# Patient Record
Sex: Male | Born: 1950 | Race: Black or African American | Hispanic: No | Smoking: Former smoker
Health system: Southern US, Community
[De-identification: ages and names within clinical notes are randomized; demographics above are authoritative.]

## PROBLEM LIST (undated history)

## (undated) DIAGNOSIS — F1010 Alcohol abuse, uncomplicated: Secondary | ICD-10-CM

## (undated) DIAGNOSIS — I10 Essential (primary) hypertension: Secondary | ICD-10-CM

## (undated) DIAGNOSIS — I4891 Unspecified atrial fibrillation: Secondary | ICD-10-CM

## (undated) DIAGNOSIS — E785 Hyperlipidemia, unspecified: Secondary | ICD-10-CM

## (undated) DIAGNOSIS — R569 Unspecified convulsions: Secondary | ICD-10-CM

## (undated) HISTORY — DX: Hyperlipidemia, unspecified: E78.5

## (undated) HISTORY — PX: SHOULDER SURGERY: SHX246

## (undated) HISTORY — DX: Unspecified atrial fibrillation: I48.91

---

## 2014-08-23 DIAGNOSIS — I4891 Unspecified atrial fibrillation: Secondary | ICD-10-CM

## 2014-08-23 HISTORY — DX: Unspecified atrial fibrillation: I48.91

## 2014-08-25 ENCOUNTER — Emergency Department (HOSPITAL_COMMUNITY): Payer: Self-pay

## 2014-08-25 ENCOUNTER — Encounter (HOSPITAL_COMMUNITY): Payer: Self-pay | Admitting: *Deleted

## 2014-08-25 ENCOUNTER — Inpatient Hospital Stay (HOSPITAL_COMMUNITY)
Admission: EM | Admit: 2014-08-25 | Discharge: 2014-09-08 | DRG: 897 | Disposition: A | Discharge status: Home Health | Payer: Self-pay | Attending: Internal Medicine | Admitting: Internal Medicine

## 2014-08-25 DIAGNOSIS — Y901 Blood alcohol level of 20-39 mg/100 ml: Secondary | ICD-10-CM | POA: Diagnosis present

## 2014-08-25 DIAGNOSIS — F329 Major depressive disorder, single episode, unspecified: Secondary | ICD-10-CM | POA: Diagnosis present

## 2014-08-25 DIAGNOSIS — R7303 Prediabetes: Secondary | ICD-10-CM | POA: Diagnosis present

## 2014-08-25 DIAGNOSIS — E872 Acidosis, unspecified: Secondary | ICD-10-CM | POA: Diagnosis present

## 2014-08-25 DIAGNOSIS — R7989 Other specified abnormal findings of blood chemistry: Secondary | ICD-10-CM

## 2014-08-25 DIAGNOSIS — F1020 Alcohol dependence, uncomplicated: Secondary | ICD-10-CM | POA: Diagnosis present

## 2014-08-25 DIAGNOSIS — E876 Hypokalemia: Secondary | ICD-10-CM | POA: Diagnosis not present

## 2014-08-25 DIAGNOSIS — F10230 Alcohol dependence with withdrawal, uncomplicated: Principal | ICD-10-CM | POA: Diagnosis present

## 2014-08-25 DIAGNOSIS — R739 Hyperglycemia, unspecified: Secondary | ICD-10-CM | POA: Diagnosis present

## 2014-08-25 DIAGNOSIS — D696 Thrombocytopenia, unspecified: Secondary | ICD-10-CM | POA: Diagnosis present

## 2014-08-25 DIAGNOSIS — I429 Cardiomyopathy, unspecified: Secondary | ICD-10-CM | POA: Diagnosis present

## 2014-08-25 DIAGNOSIS — F10239 Alcohol dependence with withdrawal, unspecified: Secondary | ICD-10-CM | POA: Diagnosis present

## 2014-08-25 DIAGNOSIS — E785 Hyperlipidemia, unspecified: Secondary | ICD-10-CM | POA: Diagnosis present

## 2014-08-25 DIAGNOSIS — Z91018 Allergy to other foods: Secondary | ICD-10-CM

## 2014-08-25 DIAGNOSIS — I4892 Unspecified atrial flutter: Secondary | ICD-10-CM | POA: Diagnosis present

## 2014-08-25 DIAGNOSIS — R778 Other specified abnormalities of plasma proteins: Secondary | ICD-10-CM | POA: Diagnosis present

## 2014-08-25 DIAGNOSIS — R945 Abnormal results of liver function studies: Secondary | ICD-10-CM

## 2014-08-25 DIAGNOSIS — I10 Essential (primary) hypertension: Secondary | ICD-10-CM | POA: Diagnosis present

## 2014-08-25 DIAGNOSIS — R0602 Shortness of breath: Secondary | ICD-10-CM

## 2014-08-25 DIAGNOSIS — I4891 Unspecified atrial fibrillation: Secondary | ICD-10-CM | POA: Diagnosis present

## 2014-08-25 DIAGNOSIS — K7010 Alcoholic hepatitis without ascites: Secondary | ICD-10-CM | POA: Diagnosis present

## 2014-08-25 DIAGNOSIS — G4089 Other seizures: Secondary | ICD-10-CM | POA: Diagnosis present

## 2014-08-25 DIAGNOSIS — I248 Other forms of acute ischemic heart disease: Secondary | ICD-10-CM | POA: Diagnosis present

## 2014-08-25 DIAGNOSIS — R569 Unspecified convulsions: Secondary | ICD-10-CM

## 2014-08-25 DIAGNOSIS — F10939 Alcohol use, unspecified with withdrawal, unspecified: Secondary | ICD-10-CM | POA: Diagnosis present

## 2014-08-25 HISTORY — DX: Essential (primary) hypertension: I10

## 2014-08-25 HISTORY — DX: Alcohol abuse, uncomplicated: F10.10

## 2014-08-25 HISTORY — DX: Unspecified convulsions: R56.9

## 2014-08-25 LAB — CBC
HEMATOCRIT: 42.9 % (ref 39.0–52.0)
Hemoglobin: 14.4 g/dL (ref 13.0–17.0)
MCH: 33.4 pg (ref 26.0–34.0)
MCHC: 33.6 g/dL (ref 30.0–36.0)
MCV: 99.5 fL (ref 78.0–100.0)
Platelets: 108 10*3/uL — ABNORMAL LOW (ref 150–400)
RBC: 4.31 MIL/uL (ref 4.22–5.81)
RDW: 13.3 % (ref 11.5–15.5)
WBC: 3.5 10*3/uL — ABNORMAL LOW (ref 4.0–10.5)

## 2014-08-25 LAB — BASIC METABOLIC PANEL
BUN: 7 mg/dL (ref 6–23)
CO2: 13 mmol/L — ABNORMAL LOW (ref 19–32)
Calcium: 9.3 mg/dL (ref 8.4–10.5)
Chloride: 105 mmol/L (ref 96–112)
Creatinine, Ser: 1.21 mg/dL (ref 0.50–1.35)
GFR calc Af Amer: 72 mL/min — ABNORMAL LOW (ref >90)
GFR calc non Af Amer: 62 mL/min — ABNORMAL LOW (ref >90)
GLUCOSE: 190 mg/dL — AB (ref 70–99)
Potassium: 4.1 mmol/L (ref 3.5–5.1)
Sodium: 143 mmol/L (ref 135–145)

## 2014-08-25 LAB — LACTIC ACID, PLASMA: LACTIC ACID, VENOUS: 2.4 mmol/L — AB (ref 0.5–2.0)

## 2014-08-25 LAB — RAPID URINE DRUG SCREEN, HOSP PERFORMED
Amphetamines: NOT DETECTED
BARBITURATES: NOT DETECTED
BENZODIAZEPINES: NOT DETECTED
Cocaine: NOT DETECTED
OPIATES: NOT DETECTED
Tetrahydrocannabinol: POSITIVE — AB

## 2014-08-25 LAB — MRSA PCR SCREENING: MRSA by PCR: NEGATIVE

## 2014-08-25 LAB — HEPATIC FUNCTION PANEL
ALT: 191 U/L — ABNORMAL HIGH (ref 0–53)
AST: 280 U/L — ABNORMAL HIGH (ref 0–37)
Albumin: 4.5 g/dL (ref 3.5–5.2)
Alkaline Phosphatase: 70 U/L (ref 39–117)
BILIRUBIN DIRECT: 0.4 mg/dL (ref 0.0–0.5)
BILIRUBIN TOTAL: 1.7 mg/dL — AB (ref 0.3–1.2)
Indirect Bilirubin: 1.3 mg/dL — ABNORMAL HIGH (ref 0.3–0.9)
TOTAL PROTEIN: 9 g/dL — AB (ref 6.0–8.3)

## 2014-08-25 LAB — TROPONIN I: Troponin I: 0.07 ng/mL — ABNORMAL HIGH (ref <0.031)

## 2014-08-25 LAB — CBG MONITORING, ED: Glucose-Capillary: 178 mg/dL — ABNORMAL HIGH (ref 70–99)

## 2014-08-25 LAB — ETHANOL: Alcohol, Ethyl (B): 34 mg/dL — ABNORMAL HIGH (ref 0–9)

## 2014-08-25 LAB — I-STAT TROPONIN, ED: TROPONIN I, POC: 0.01 ng/mL (ref 0.00–0.08)

## 2014-08-25 IMAGING — CR DG CHEST 1V PORT
1 series · 1 of 1 positions shown · non-contrast
Comparison: None.

CLINICAL DATA: Shortness of breath

EXAM:
PORTABLE CHEST - 1 VIEW

[AP]
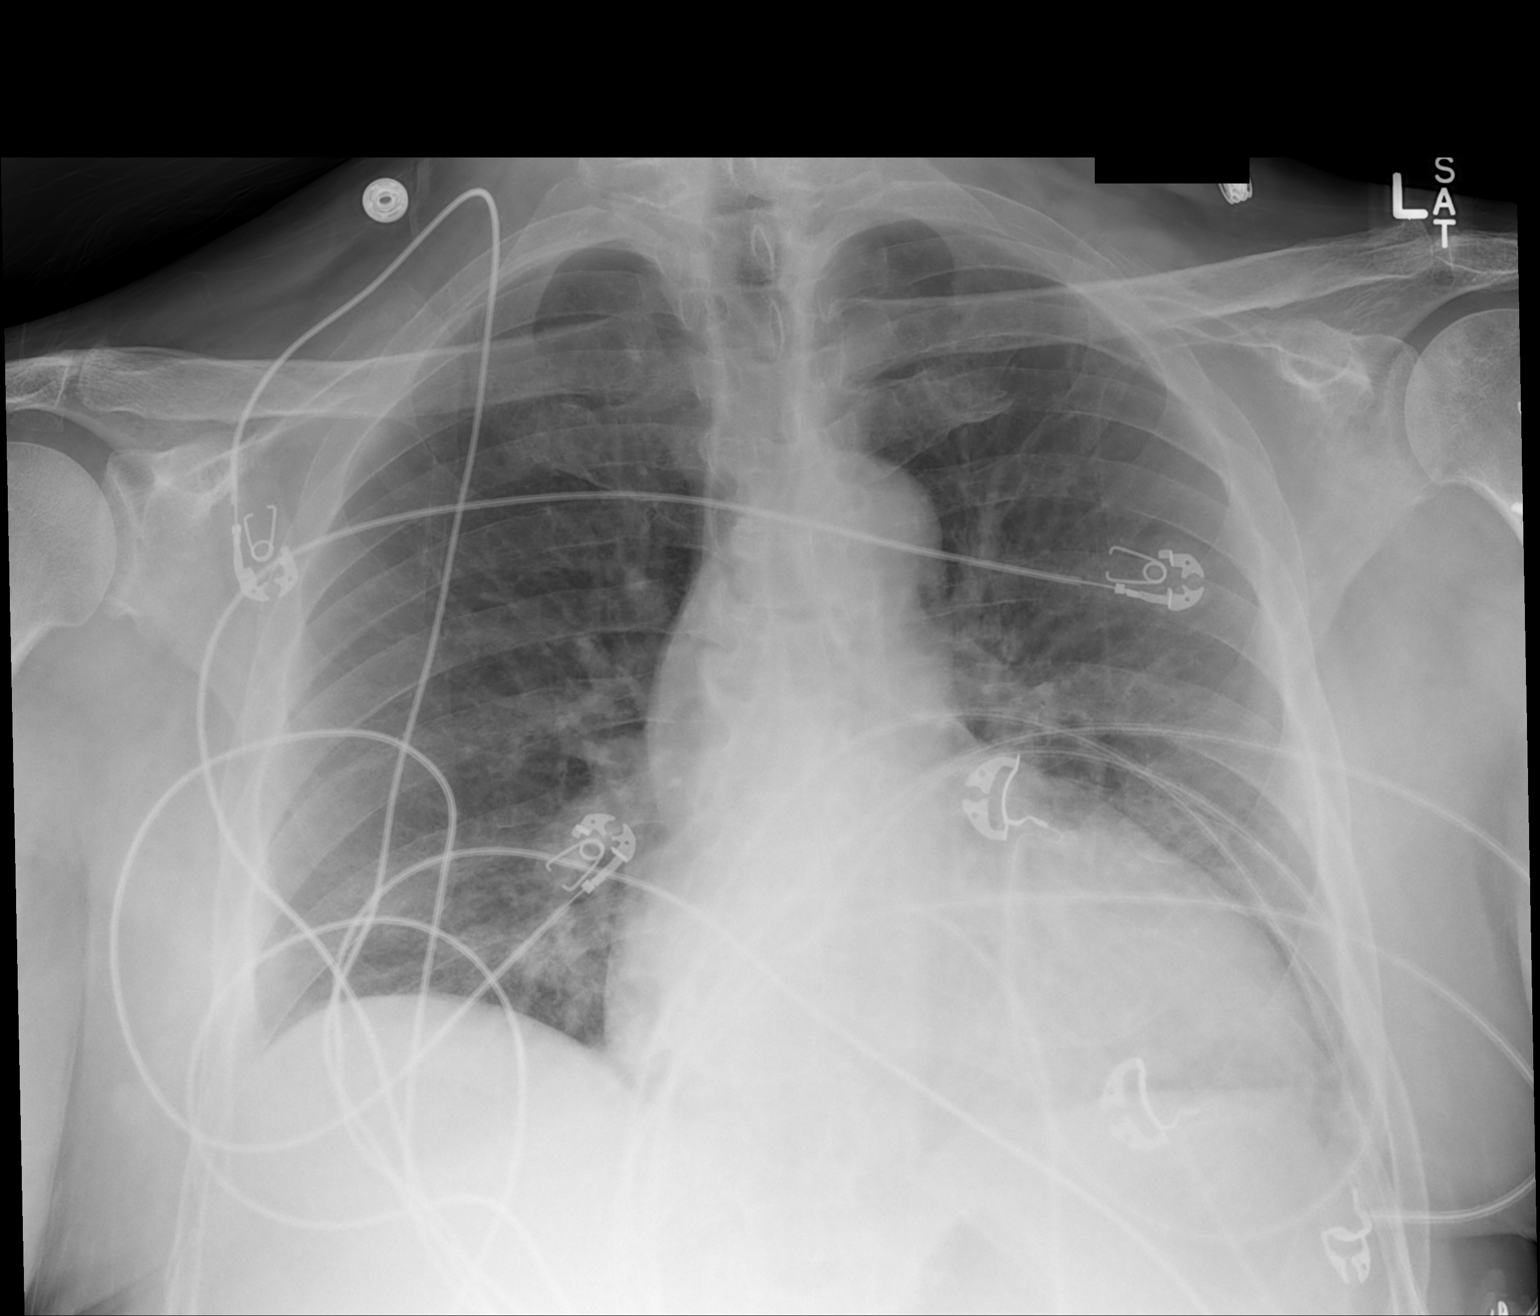

[1 of 1 positions shown; findings below may reference images not displayed]

FINDINGS: The heart size is moderately enlarged without evidence for edema.
Cardiac leads obscure detail. Both lungs are clear. The visualized
skeletal structures are unremarkable.
IMPRESSION: Moderate cardiomegaly with central vascular congestion but no focal
acute finding.

## 2014-08-25 IMAGING — CT CT HEAD W/O CM
2 series · 17 of 30 positions shown, 20 images · non-contrast
Comparison: None.

CLINICAL DATA: Ethanol abuse and seizure.

EXAM:
CT HEAD WITHOUT CONTRAST
TECHNIQUE: Contiguous axial images were obtained from the base of the skull
through the vertex without intravenous contrast.

[Series 2: head w/o · axial · non-contrast · 0.46mm/px · z∈[-241,-126]mm · 9 of 29 slices shown, 12 images]
[im 3/29  brain]
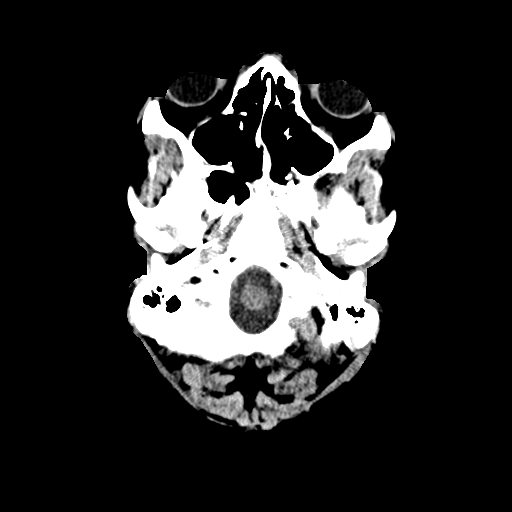
[im 3/29  bone]
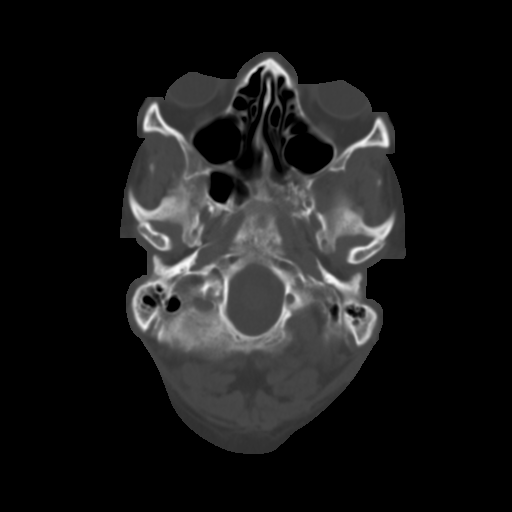
[im 6/29  brain]
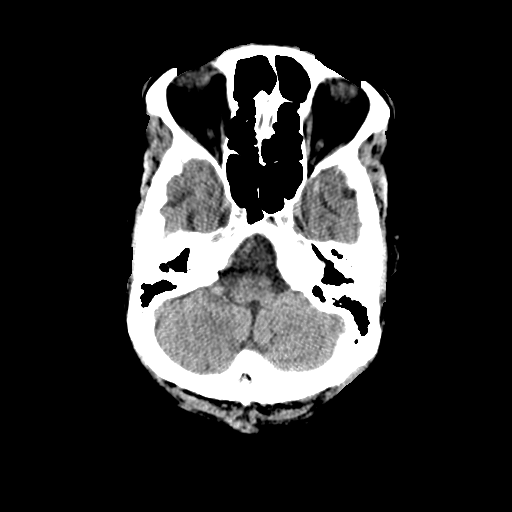
[im 9/29  brain]
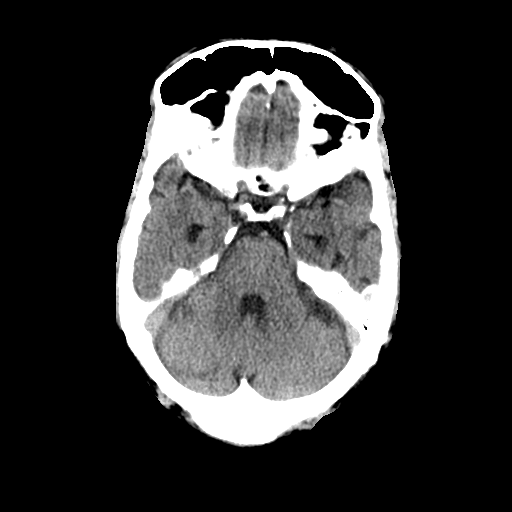
[im 12/29  brain]
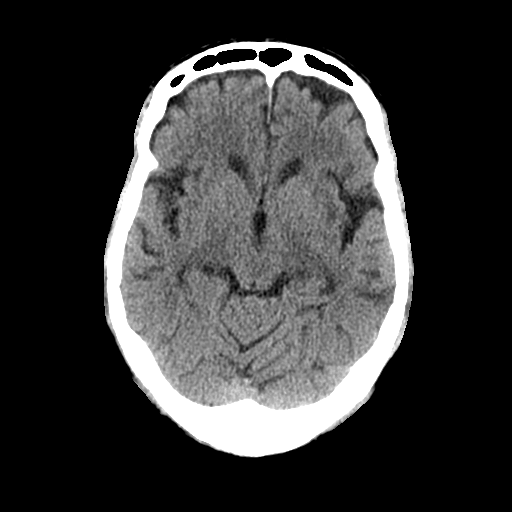
[im 15/29  brain]
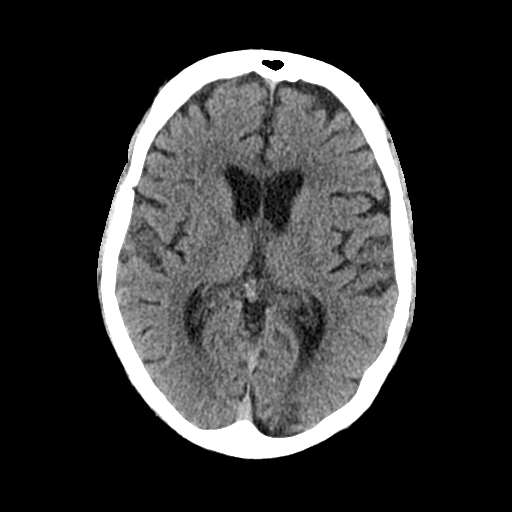
[im 15/29  bone]
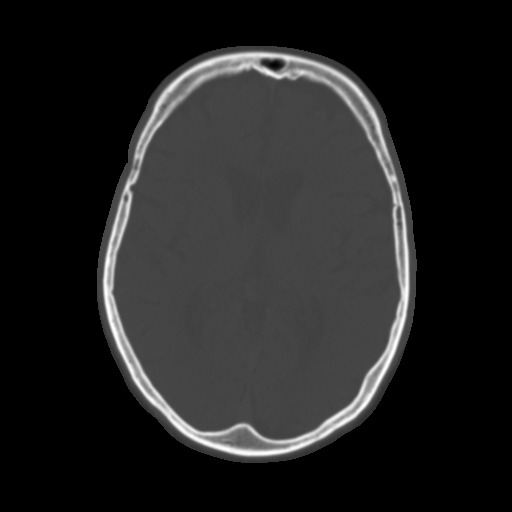
[im 17/29  brain]
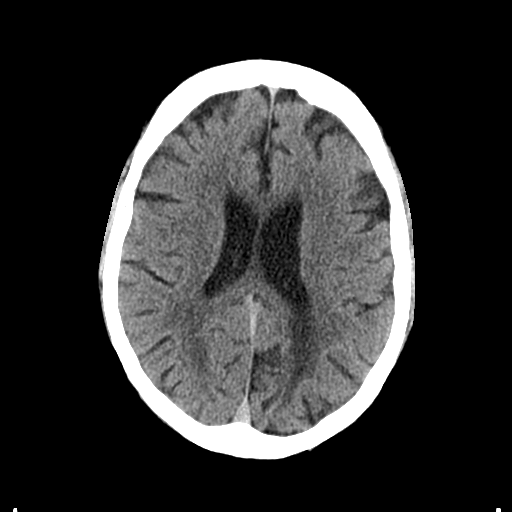
[im 20/29  brain]
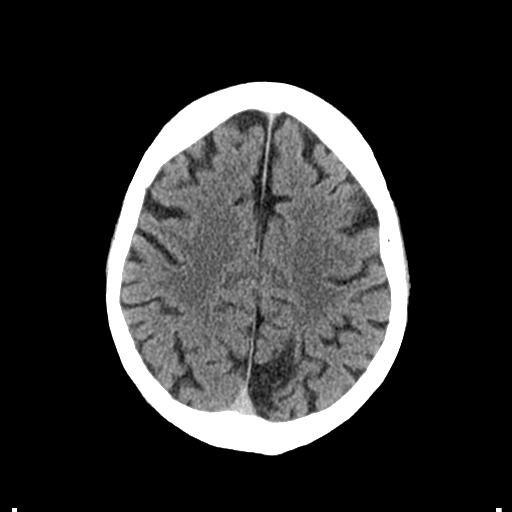
[im 23/29  brain]
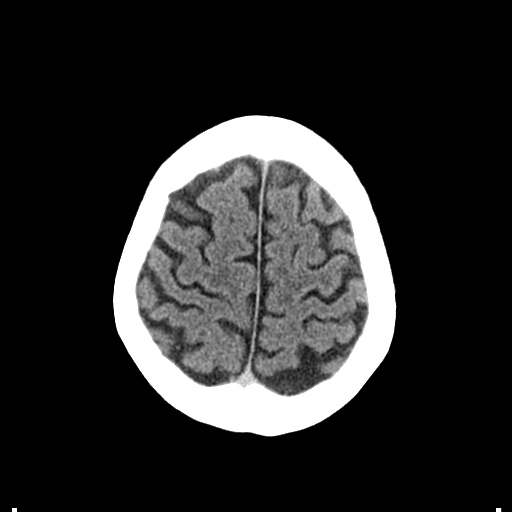
[im 26/29  brain]
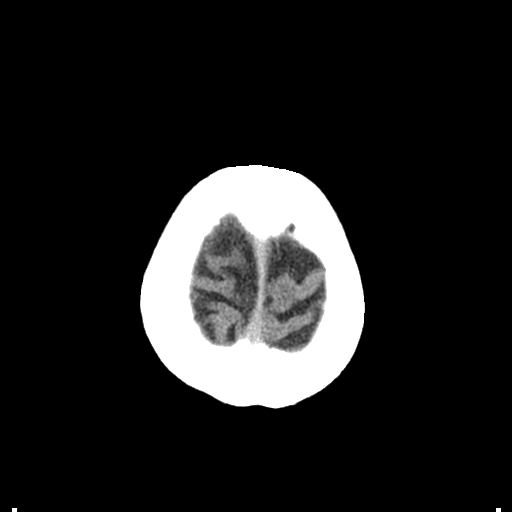
[im 26/29  bone]
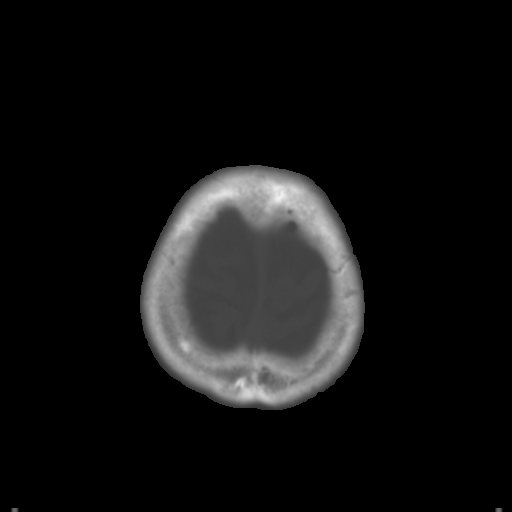

[Series 3: bone windows · axial · 0.46mm/px · z∈[-236,-128]mm · 8 of 48 slices shown]
[im 6/48  bone]
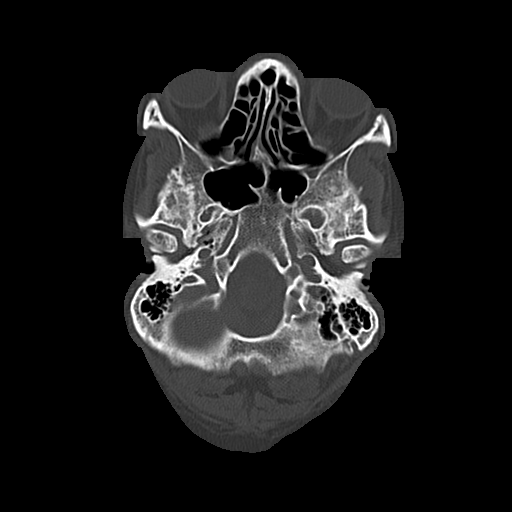
[im 11/48  bone]
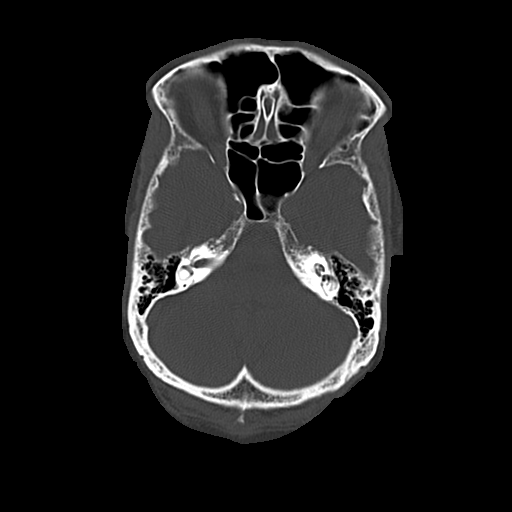
[im 16/48  bone]
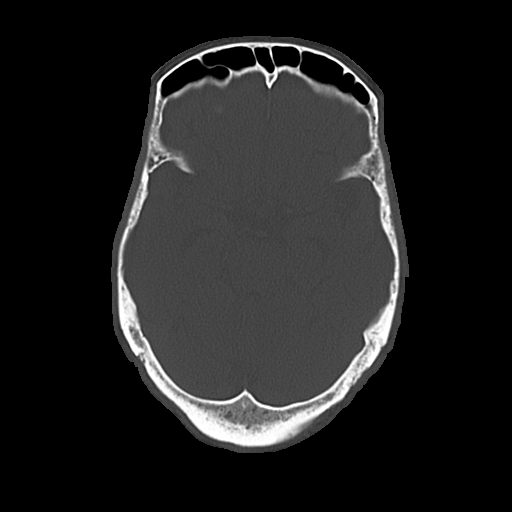
[im 21/48  bone]
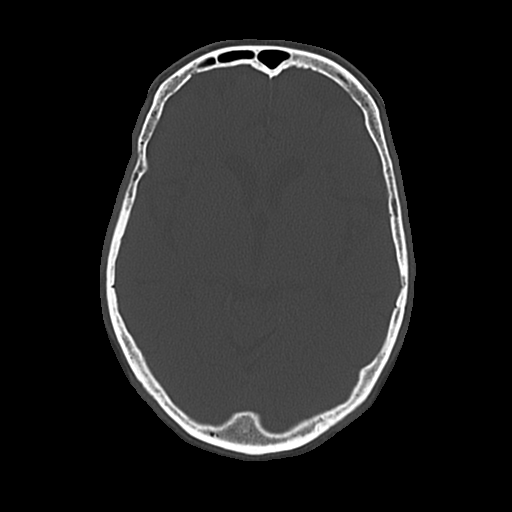
[im 27/48  bone]
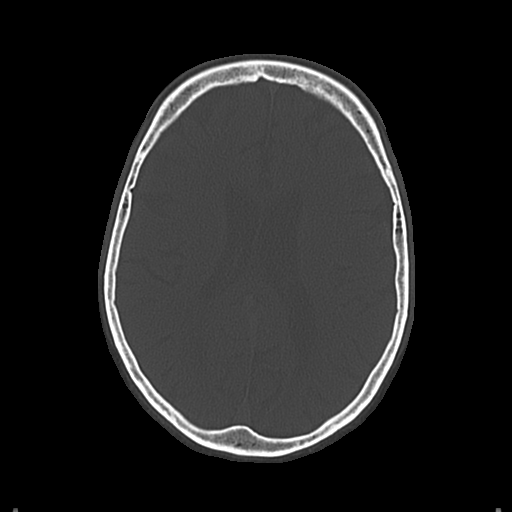
[im 32/48  bone]
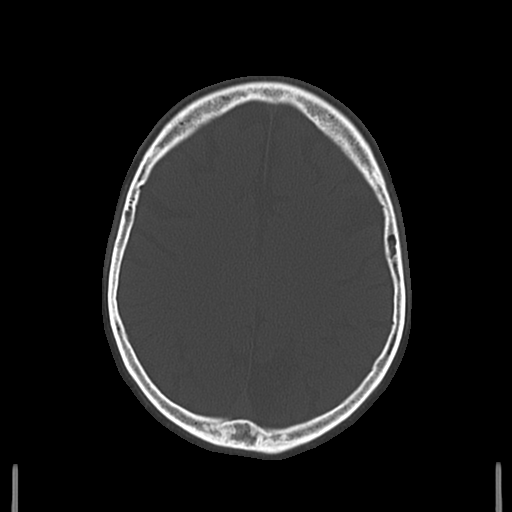
[im 37/48  bone]
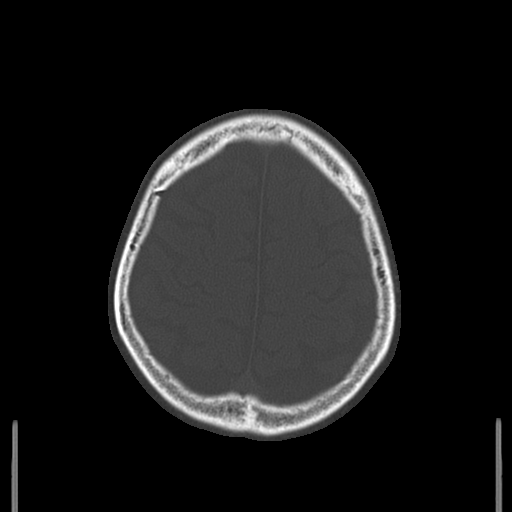
[im 42/48  bone]
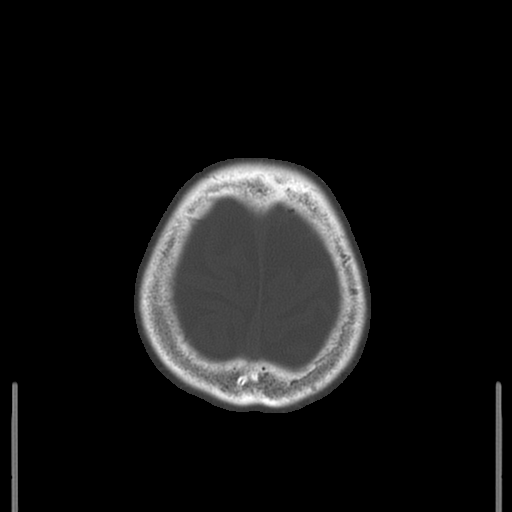

[17 of 30 positions shown; findings below may reference images not displayed]

FINDINGS: Sinuses/Soft tissues: Clear paranasal sinuses and mastoid air cells.

Intracranial: Age advanced cerebral atrophy. Left occipital lobe
hypoattenuation is most consistent with remote infarct. Mild low
density in the periventricular white matter likely related to small
vessel disease. No mass lesion, hemorrhage, hydrocephalus, acute
infarct, intra-axial, or extra-axial fluid collection.
IMPRESSION: 1. Left occipital lobe hypoattenuation is favored to represent
remote PCA infarct. No significant mass effect to suggest acute or
subacute process.
2.  Cerebral atrophy and small vessel ischemic change.

## 2014-08-25 MED ORDER — VITAMIN B-1 100 MG PO TABS
100.0000 mg | ORAL_TABLET | Freq: Every day | ORAL | Status: DC
  Administered 2014-08-26 – 2014-09-08 (×14): 100 mg via ORAL
  Filled 2014-08-25 (×14): qty 1

## 2014-08-25 MED ORDER — ONDANSETRON HCL 4 MG/2ML IJ SOLN
4.0000 mg | Freq: Four times a day (QID) | INTRAMUSCULAR | Status: DC | PRN
  Administered 2014-08-25: 4 mg via INTRAVENOUS
  Filled 2014-08-25: qty 2

## 2014-08-25 MED ORDER — SODIUM CHLORIDE 0.9 % IV SOLN
INTRAVENOUS | Status: DC
  Administered 2014-08-25: 19:00:00 via INTRAVENOUS

## 2014-08-25 MED ORDER — LORAZEPAM 1 MG PO TABS: 1.0000 mg | ORAL_TABLET | Freq: Four times a day (QID) | ORAL | Status: AC | PRN

## 2014-08-25 MED ORDER — DILTIAZEM HCL 25 MG/5ML IV SOLN: 20.0000 mg | Freq: Once | INTRAVENOUS | Status: DC

## 2014-08-25 MED ORDER — ACETAMINOPHEN 650 MG RE SUPP: 650.0000 mg | Freq: Four times a day (QID) | RECTAL | Status: DC | PRN

## 2014-08-25 MED ORDER — LORAZEPAM 2 MG/ML IJ SOLN: 0.0000 mg | Freq: Two times a day (BID) | INTRAMUSCULAR | Status: AC

## 2014-08-25 MED ORDER — ASPIRIN EC 81 MG PO TBEC
81.0000 mg | DELAYED_RELEASE_TABLET | Freq: Every day | ORAL | Status: DC
  Administered 2014-08-25 – 2014-08-28 (×4): 81 mg via ORAL
  Filled 2014-08-25 (×4): qty 1

## 2014-08-25 MED ORDER — LORAZEPAM 2 MG/ML IJ SOLN
2.0000 mg | Freq: Once | INTRAMUSCULAR | Status: AC
  Administered 2014-08-25: 2 mg via INTRAVENOUS
  Filled 2014-08-25: qty 1

## 2014-08-25 MED ORDER — SODIUM CHLORIDE 0.9 % IV SOLN
Freq: Once | INTRAVENOUS | Status: AC
  Administered 2014-08-25: 15:00:00 via INTRAVENOUS

## 2014-08-25 MED ORDER — ONDANSETRON HCL 4 MG PO TABS: 4.0000 mg | ORAL_TABLET | Freq: Four times a day (QID) | ORAL | Status: DC | PRN

## 2014-08-25 MED ORDER — LORAZEPAM 2 MG/ML IJ SOLN: 1.0000 mg | Freq: Four times a day (QID) | INTRAMUSCULAR | Status: AC | PRN

## 2014-08-25 MED ORDER — DEXTROSE 5 % IV SOLN
5.0000 mg/h | INTRAVENOUS | Status: AC
  Administered 2014-08-25: 5 mg/h via INTRAVENOUS
  Administered 2014-08-26 (×3): 15 mg/h via INTRAVENOUS

## 2014-08-25 MED ORDER — ADULT MULTIVITAMIN W/MINERALS CH
1.0000 | ORAL_TABLET | Freq: Every day | ORAL | Status: DC
  Administered 2014-08-25 – 2014-09-08 (×15): 1 via ORAL
  Filled 2014-08-25 (×15): qty 1

## 2014-08-25 MED ORDER — DILTIAZEM HCL 100 MG IV SOLR
5.0000 mg/h | Freq: Once | INTRAVENOUS | Status: AC
  Administered 2014-08-25: 5 mg/h via INTRAVENOUS

## 2014-08-25 MED ORDER — THIAMINE HCL 100 MG/ML IJ SOLN
100.0000 mg | Freq: Every day | INTRAMUSCULAR | Status: DC
  Administered 2014-08-25: 100 mg via INTRAVENOUS
  Filled 2014-08-25 (×4): qty 2

## 2014-08-25 MED ORDER — ALUM & MAG HYDROXIDE-SIMETH 200-200-20 MG/5ML PO SUSP: 30.0000 mL | Freq: Four times a day (QID) | ORAL | Status: DC | PRN

## 2014-08-25 MED ORDER — LORAZEPAM 2 MG/ML IJ SOLN
1.0000 mg | INTRAMUSCULAR | Status: DC | PRN
  Administered 2014-08-25: 1 mg via INTRAVENOUS
  Filled 2014-08-25: qty 1

## 2014-08-25 MED ORDER — FOLIC ACID 1 MG PO TABS
1.0000 mg | ORAL_TABLET | Freq: Every day | ORAL | Status: DC
  Administered 2014-08-25 – 2014-09-08 (×15): 1 mg via ORAL
  Filled 2014-08-25 (×15): qty 1

## 2014-08-25 MED ORDER — DILTIAZEM LOAD VIA INFUSION
20.0000 mg | Freq: Once | INTRAVENOUS | Status: AC
  Administered 2014-08-25: 20 mg via INTRAVENOUS
  Filled 2014-08-25: qty 20

## 2014-08-25 MED ORDER — LORAZEPAM 2 MG/ML IJ SOLN
0.0000 mg | Freq: Four times a day (QID) | INTRAMUSCULAR | Status: AC
  Administered 2014-08-25: 2 mg via INTRAVENOUS
  Filled 2014-08-25: qty 1

## 2014-08-25 MED ORDER — ONDANSETRON HCL 4 MG/2ML IJ SOLN
4.0000 mg | Freq: Once | INTRAMUSCULAR | Status: AC
  Administered 2014-08-25: 4 mg via INTRAVENOUS
  Filled 2014-08-25: qty 2

## 2014-08-25 MED ORDER — ACETAMINOPHEN 325 MG PO TABS: 650.0000 mg | ORAL_TABLET | Freq: Four times a day (QID) | ORAL | Status: DC | PRN

## 2014-08-25 NOTE — H&P (Signed)
History and Physical:    Benjamin Blackwell   ZOX:[id number] DOB: 1951-02-09 DOA: 08/25/2014  Referring physician: Dr. Rolland Porter PCP: No primary care provider on file.   Chief Complaint: Seizure in the setting of ETOH dependency  History of Present Illness:   Benjamin Blackwell is an 64 y.o. male with a PMH of alcoholism who is visiting out of town to attend a Wedding and who was noted to be tremulous all day after not drinking any alcohol this morning.  While eating out, the patient was noted to be diaphoretic, seemed altered.  The patient's daughter says that he did not respond to her calling his name, and a short time later had a witnessed tonic-clonic seizure.  EMS was called and he was brought to the ED for further evaluation.  The patient has had seizures in the past related to alcohol withdrawal, the last one in January.  The patient's daughter tells me that he has been drinking especially heavy over the past month.  ROS:   Unable to obtain due to the patient's altered mental status.   Past Medical History:   Past Medical History  Diagnosis Date  . Hypertension   . ETOH abuse   . Seizures     ETOH related    Past Surgical History:   Past Surgical History  Procedure Laterality Date  . Shoulder surgery      left shoulder    Social History:   History   Social History  . Marital Status: Divorced    Spouse Name: N/A  . Number of Children: 1  . Years of Education: N/A   Occupational History  . Laborer    Social History Main Topics  . Smoking status: Never Smoker   . Smokeless tobacco: Not on file  . Alcohol Use: 0.0 oz/week    0 Standard drinks or equivalent per week     Comment: Daily, 12 pack a day, sometimes more, occasional liquor  . Drug Use: Yes     Comment: Marijuana  . Sexual Activity: Not on file   Other Topics Concern  . Not on file   Social History Narrative   Lives with his common law girlfriend.  Employed full time as a Nature conservation officer.       Family history:   Family History  Problem Relation Age of Onset  . Heart attack Mother   . Heart attack Father   . Heart attack Brother   . Heart attack Brother   . Diabetes Father   . Diabetes Brother   . Diabetes Sister     Allergies   Beef-derived products and Pork-derived products  Current Medications:   Prior to Admission medications   Medication Sig Start Date End Date Taking? Authorizing Provider  PRESCRIPTION MEDICATION Patient is supposed to be taking medication for High Blood Pressure but has not taken it in about 5 years according to daughter.   Yes Historical Provider, MD    Physical Exam:   Filed Vitals:   08/25/14 1550 08/25/14 1615 08/25/14 1700 08/25/14 1715  BP: 130/72 125/89 150/84 147/90  Pulse: 95 86 68 97  Temp:      TempSrc:      Resp: 32 SpO2: 96% 95% 97% 97%     Physical Exam: Blood pressure 147/90, pulse 97, temperature 97.5 F (36.4 C), temperature source Oral, resp. rate 28, SpO2 97 %. Gen: No acute distress.  Tremulous Head: Normocephalic, atraumatic. Eyes: PERRL, EOMI, ? sclerae mildly  nicteric. Mouth: Oropharynx with dry mucous membranes. Neck: Supple, no thyromegaly, no lymphadenopathy, no jugular venous distention. Chest: Lungs diminished.  Clear.   CV: Heart sounds are irregularly irregular and tachycardic.  No M/R/G. Abdomen: Soft, nontender, nondistended with normal active bowel sounds. Extremities: Extremities are without C/E/C. Skin: Warm and dry. Neuro: Lethargic/post ictal; cranial nerves II through XII grossly intact. Psych: Mood and affect depressed.   Data Review:    Labs: Basic Metabolic Panel:  Recent Labs Lab 08/25/14 1510  NA 143  K 4.1  CL 105  CO2 13*  GLUCOSE 190*  BUN 7  CREATININE 1.21  CALCIUM 9.3   Liver Function Tests: No results for input(s): AST, ALT, ALKPHOS, BILITOT, PROT, ALBUMIN in the last 168 hours. No results for input(s): LIPASE, AMYLASE in the last 168 hours. No  results for input(s): AMMONIA in the last 168 hours. CBC:  Recent Labs Lab 08/25/14 1510  WBC 3.5*  HGB 14.4  HCT 42.9  MCV 99.5  PLT 108*  CBG:  Recent Labs Lab 08/25/14 1445  GLUCAP 178*    Radiographic Studies: Ct Head Wo Contrast  08/25/2014   CLINICAL DATA:  Ethanol abuse and seizure.  EXAM: CT HEAD WITHOUT CONTRAST  TECHNIQUE: Contiguous axial images were obtained from the base of the skull through the vertex without intravenous contrast.  COMPARISON:  None.  FINDINGS: Sinuses/Soft tissues: Clear paranasal sinuses and mastoid air cells.  Intracranial: Age advanced cerebral atrophy. Left occipital lobe hypoattenuation is most consistent with remote infarct. Mild low density in the periventricular white matter likely related to small vessel disease. No mass lesion, hemorrhage, hydrocephalus, acute infarct, intra-axial, or extra-axial fluid collection.  IMPRESSION: 1. 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 No significant mass effect to suggest acute or subacute process. 2.  Cerebral atrophy and small vessel ischemic change.   Electronically Signed   By: Jeronimo GreavesKyle  Talbot M.D.   On: 08/25/2014 17:07   Dg Chest Port 1 View  08/25/2014   CLINICAL DATA:  Shortness of breath  EXAM: PORTABLE CHEST - 1 VIEW  COMPARISON:  None.  FINDINGS: The heart size is moderately enlarged without evidence for edema. Cardiac leads obscure detail. Both lungs are clear. The visualized skeletal structures are unremarkable.  IMPRESSION: Moderate cardiomegaly with central vascular congestion but no focal acute finding.   Electronically Signed   By: Christiana PellantGretchen  Green M.D.   On: 08/25/2014 15:48   *I have personally reviewed the images above* EKG: Independently reviewed. Atrial fibrillation w/ RVR at 130 bpm.   Assessment/Plan:   Principal Problem:   Alcohol withdrawal seizure / Alcohol dependency - Admit to SDU. - Seizure precautions. - Detox with Ativan per CIWA  protocol. - Supplement thiamine, folic acid.  Active Problems:   Atrial fibrillation with RVR - Check TSH, cycle cardiac markers. - Use Cardizem drip for rate control. - ASA started. - Cardiomegaly noted on CXR.  Check BNP.  If elevated, will get Echo.    Hypertension - Use Cardizem for now.  Add Clonidine if needed.    Hyperglycemia - Check hemoglobin A1c.    Thrombocytopenia - Likely from toxic effects of ETOH on bone marrow. - Avoid heparin/Lovenox (has pork allergy).    DVT prophylaxis - SCDs ordered.  Code Status: Full. Family Communication: Daughter Garvin FilaKristie Nixon [phone];  Significant other: Corrie DandyMary French [phone] Disposition Plan: Home when stable.  Time spent: 1 hour.  RAMA,CHRISTINA [facility] Pager [phone] Cell: [phone]   If  7PM-7AM, please contact night-coverage [email or web address] Password [facility] 08/25/2014, 5:41 PM

## 2014-08-25 NOTE — ED Notes (Signed)
Pt provided with a urinal, unable to void at this time.

## 2014-08-25 NOTE — ED Notes (Addendum)
Pt hx of ETOH abuse, drinks daily, half a case, last ETOH last night around 2200-midnight. Hx of seizures due to ETOH withdrawal in January. Ems reports pt had seizure today, pt incontinent of urine. Hx of HTN. Denies pain.   Family reports pt is non complaint with medications and has not taken BP meds in 5 years. Pt has never been officially evaluated for seizures, does not take seizure medication.

## 2014-08-25 NOTE — ED Notes (Signed)
Bed: RESB Expected date:  Expected time:  Means of arrival:  Comments: Ems- a fib

## 2014-08-25 NOTE — ED Notes (Signed)
Monitor stated flutter, md made aware and at bedside.

## 2014-08-25 NOTE — Progress Notes (Signed)
CRITICAL VALUE ALERT  Critical value received:  Lactic acid 2.4  Date of notification:  08/25/2014  Time of notification:  1955  Critical value read back:Yes.    Nurse who received alert:  Effie BerkshireAlex Belem Hintze  MD notified (1st page):  Camila Lisman  Time of first page:  2000  MD notified (2nd page):  Time of second page:  Responding MD:  Camila Lisman  Time MD responded: 2009

## 2014-08-25 NOTE — ED Notes (Signed)
Pt took all belongings home

## 2014-08-25 NOTE — ED Notes (Signed)
hospitalist at bedside

## 2014-08-25 NOTE — ED Provider Notes (Signed)
CSN: [id number]     Arrival date & time 08/25/14  1439 History   First MD Initiated Contact with Patient 08/25/14 1500     Chief Complaint  Patient presents with  . seizure, ETOH withdrawal      HPI  Patient presents for evaluation via EMS. Had a witnessed seizure. Was with family restaurant. Is visiting from town. Lives in [place]. Is in town for a family wedding. History of alcohol is a. History of alcohol which all seizures. Family states that he seemed "okay" this morning. However they went to a restaurant he seems shaky. He broke out in a sweat. He had a generalized seizure. They helped him to the ground. He was incontinent. He awakened en route after a postictal period. Noted to be in rapid A. fib upon his arrival and en route. No known history of  atrial fibrillation.  Occasional marijuana. Denies illicit drug use. States his last drink was sometime last night.  Past Medical History  Diagnosis Date  . Hypertension   . ETOH abuse   . Seizures     ETOH related   Past Surgical History  Procedure Laterality Date  . Shoulder surgery      left shoulder   History reviewed. No pertinent family history. History  Substance Use Topics  . Smoking status: Never Smoker   . Smokeless tobacco: Not on file  . Alcohol Use: Yes     Comment: daily     Review of Systems  Constitutional: Negative for fever, chills, diaphoresis, appetite change and fatigue.  HENT: Negative for mouth sores, sore throat and trouble swallowing.   Eyes: Negative for visual disturbance.  Respiratory: Negative for cough, chest tightness and wheezing.   Cardiovascular: Positive for palpitations. Negative for chest pain.  Gastrointestinal: Negative for nausea, vomiting, abdominal pain, diarrhea and abdominal distention.  Endocrine: Negative for polydipsia, polyphagia and polyuria.  Genitourinary: Negative for dysuria, frequency and hematuria.  Musculoskeletal: Negative for gait problem.  Skin:  Negative for color change, pallor and rash.  Neurological: Positive for seizures. Negative for dizziness, syncope, light-headedness and headaches.  Hematological: Does not bruise/bleed easily.  Psychiatric/Behavioral: Negative for behavioral problems and confusion.      Allergies  Beef-derived products and Pork-derived products  Home Medications   Prior to Admission medications   Medication Sig Start Date End Date Taking? Authorizing Provider  PRESCRIPTION MEDICATION Patient is supposed to be taking medication for High Blood Pressure but has not taken it in about 5 years according to daughter.   Yes Historical Provider, MD   BP 130/72 mmHg  Pulse 86  Temp(Src) 97.5 F (36.4 C) (Oral)  Resp 26  SpO2 95% Physical Exam  Constitutional: He is oriented to person, place, and time. He appears well-developed and well-nourished. No distress.  HENT:  Head: Normocephalic.  Eyes: Conjunctivae are normal. Pupils are equal, round, and reactive to light. No scleral icterus.  Neck: Normal range of motion. Neck supple. No thyromegaly present.  Cardiovascular: An irregularly irregular rhythm present. Tachycardia present.  Exam reveals no gallop and no friction rub.   No murmur heard. Pulmonary/Chest: Effort normal and breath sounds normal. No respiratory distress. He has no wheezes. He has no rales.  Abdominal: Soft. Bowel sounds are normal. He exhibits no distension. There is no tenderness. There is no rebound.  Musculoskeletal: Normal range of motion.  Neurological: He is alert and oriented to person, place, and time.  Resting tremor.  Skin: Skin is warm and dry. No  rash noted.  Psychiatric: He has a normal mood and affect. His behavior is normal.    ED Course  Procedures (including critical care time) Labs Review Labs Reviewed  CBC - Abnormal; Notable for the following:    WBC 3.5 (*)    Platelets 108 (*)    All other components within normal limits  BASIC METABOLIC PANEL - Abnormal;  Notable for the following:    CO2 13 (*)    Glucose, Bld 190 (*)    GFR calc non Af Amer 62 (*)    GFR calc Af Amer 72 (*)    All other components within normal limits  ETHANOL - Abnormal; Notable for the following:    Alcohol, Ethyl (B) 34 (*)    All other components within normal limits  URINE RAPID DRUG SCREEN (HOSP PERFORMED) - Abnormal; Notable for the following:    Tetrahydrocannabinol POSITIVE (*)    All other components within normal limits  CBG MONITORING, ED - Abnormal; Notable for the following:    Glucose-Capillary 178 (*)    All other components within normal limits  I-STAT TROPOININ, ED    Imaging Review Dg Chest Port 1 View  08/25/2014   CLINICAL DATA:  Shortness of breath  EXAM: PORTABLE CHEST - 1 VIEW  COMPARISON:  None.  FINDINGS: The heart size is moderately enlarged without evidence for edema. Cardiac leads obscure detail. Both lungs are clear. The visualized skeletal structures are unremarkable.  IMPRESSION: Moderate cardiomegaly with central vascular congestion but no focal acute finding.   Electronically Signed   By: Christiana PellantGretchen  Green M.D.   On: 08/25/2014 15:48     EKG Interpretation   Date/Time:  Sunday August 25 2014 14:43:31 EDT Ventricular Rate:  130 PR Interval:    QRS Duration: 95 QT Interval:  298 QTC Calculation: 438 R Axis:   81 Text Interpretation:  Atrial fibrillation Borderline right axis deviation  Nonspecific repol abnormality, diffuse leads Baseline wander in lead(s) V2  Confirmed by Fayrene FearingJAMES  MD, Richel Millspaugh ([id number]) on 08/25/2014 4:57:06 PM     CRITICAL CARE Performed by: Rolland PorterJAMES, Denesia Donelan JOSEPH   Total critical care time: 960  Start:  15:30 Stop:  16:30  Critical care time was exclusive of separately billable procedures and treating other patients.  Critical care was necessary to treat or prevent imminent or life-threatening deterioration.  Critical care was time spent personally by me on the following activities: development of treatment plan with  patient and/or surrogate as well as nursing, discussions with consultants, evaluation of patient's response to treatment, examination of patient, obtaining history from patient or surrogate, ordering and performing treatments and interventions, ordering and review of laboratory studies, ordering and review of radiographic studies, pulse oximetry and re-evaluation of patient's condition.  MDM   Final diagnoses:  SOB (shortness of breath)  Seizure   Patient in atrial flutter relation with rapid ventricular response. Given Cardizem bolus, 20 mg. Placed on an infusion at 5 mg per hour. Also noted clinically to be in alcohol all with shakes tremors. Given Ativan 2 mg IV. She repeated it half an hour. His symptoms improved. Resting. Hemodynamically stable. Heart rate improves to 98. Clinically not in congestive heart failure. No leg edema. No crackles rales or JVD.     Rolland PorterMark Porsche Noguchi, MD 08/25/14 [phone]

## 2014-08-25 NOTE — ED Notes (Signed)
Report given to Amy, RN.

## 2014-08-26 DIAGNOSIS — E872 Acidosis, unspecified: Secondary | ICD-10-CM | POA: Diagnosis present

## 2014-08-26 DIAGNOSIS — R7989 Other specified abnormal findings of blood chemistry: Secondary | ICD-10-CM | POA: Diagnosis present

## 2014-08-26 DIAGNOSIS — R945 Abnormal results of liver function studies: Secondary | ICD-10-CM

## 2014-08-26 DIAGNOSIS — E876 Hypokalemia: Secondary | ICD-10-CM | POA: Diagnosis not present

## 2014-08-26 DIAGNOSIS — I4891 Unspecified atrial fibrillation: Secondary | ICD-10-CM

## 2014-08-26 DIAGNOSIS — F10230 Alcohol dependence with withdrawal, uncomplicated: Principal | ICD-10-CM

## 2014-08-26 LAB — BASIC METABOLIC PANEL
Anion gap: 13 (ref 5–15)
BUN: 7 mg/dL (ref 6–23)
CALCIUM: 9.1 mg/dL (ref 8.4–10.5)
CHLORIDE: 106 mmol/L (ref 96–112)
CO2: 24 mmol/L (ref 19–32)
Creatinine, Ser: 0.98 mg/dL (ref 0.50–1.35)
GFR calc Af Amer: >90 mL/min (ref >90)
GFR calc non Af Amer: 86 mL/min — ABNORMAL LOW (ref >90)
GLUCOSE: 121 mg/dL — AB (ref 70–99)
POTASSIUM: 3.2 mmol/L — AB (ref 3.5–5.1)
Sodium: 143 mmol/L (ref 135–145)

## 2014-08-26 LAB — CBC
HCT: 41.1 % (ref 39.0–52.0)
HEMOGLOBIN: 14.5 g/dL (ref 13.0–17.0)
MCH: 34 pg (ref 26.0–34.0)
MCHC: 35.3 g/dL (ref 30.0–36.0)
MCV: 96.3 fL (ref 78.0–100.0)
Platelets: 103 10*3/uL — ABNORMAL LOW (ref 150–400)
RBC: 4.27 MIL/uL (ref 4.22–5.81)
RDW: 13.4 % (ref 11.5–15.5)
WBC: 6 10*3/uL (ref 4.0–10.5)

## 2014-08-26 LAB — MAGNESIUM: Magnesium: 2 mg/dL (ref 1.5–2.5)

## 2014-08-26 LAB — APTT
APTT: 57 s — AB (ref 24–37)
aPTT: 56 seconds — ABNORMAL HIGH (ref 24–37)

## 2014-08-26 LAB — TSH: TSH: 0.746 u[IU]/mL (ref 0.350–4.500)

## 2014-08-26 LAB — TROPONIN I
TROPONIN I: 0.1 ng/mL — AB (ref <0.031)
Troponin I: 0.12 ng/mL — ABNORMAL HIGH (ref <0.031)

## 2014-08-26 LAB — LACTIC ACID, PLASMA: LACTIC ACID, VENOUS: 1.1 mmol/L (ref 0.5–2.0)

## 2014-08-26 MED ORDER — POTASSIUM CHLORIDE IN NACL 40-0.9 MEQ/L-% IV SOLN
INTRAVENOUS | Status: DC
  Administered 2014-08-26 – 2014-08-27 (×3): 100 mL/h via INTRAVENOUS
  Filled 2014-08-26 (×4): qty 1000

## 2014-08-26 MED ORDER — METOPROLOL TARTRATE 1 MG/ML IV SOLN
5.0000 mg | INTRAVENOUS | Status: DC | PRN
  Administered 2014-08-26 – 2014-08-30 (×5): 5 mg via INTRAVENOUS
  Filled 2014-08-26 (×5): qty 5

## 2014-08-26 MED ORDER — ENSURE ENLIVE PO LIQD: 237.0000 mL | ORAL | Status: DC | PRN

## 2014-08-26 MED ORDER — POTASSIUM CHLORIDE CRYS ER 20 MEQ PO TBCR
40.0000 meq | EXTENDED_RELEASE_TABLET | Freq: Once | ORAL | Status: AC
  Administered 2014-08-26: 40 meq via ORAL
  Filled 2014-08-26: qty 2

## 2014-08-26 MED ORDER — SODIUM CHLORIDE 0.9 % IV SOLN
0.1500 mg/kg/h | INTRAVENOUS | Status: DC
  Administered 2014-08-26 – 2014-08-27 (×3): 0.15 mg/kg/h via INTRAVENOUS
  Filled 2014-08-26 (×4): qty 250

## 2014-08-26 MED ORDER — ATORVASTATIN CALCIUM 10 MG PO TABS
10.0000 mg | ORAL_TABLET | Freq: Every day | ORAL | Status: DC
  Administered 2014-08-26 – 2014-09-07 (×14): 10 mg via ORAL
  Filled 2014-08-26 (×14): qty 1

## 2014-08-26 NOTE — Progress Notes (Addendum)
ANTICOAGULATION CONSULT NOTE - Follow Up Consult  Pharmacy Consult for Bivalirudin Indication: chest pain/ACS  Allergies  Allergen Reactions  . Beef-Derived Products Hives  . Pork-Derived Products Hives    Patient Measurements: Height: 5\' 8"  (172.7 cm) Weight: 188 lb 7.9 oz (85.5 kg) IBW/kg (Calculated) : 68.4  Vital Signs: Temp: 98.7 F (37.1 C) (04/04 0800) Temp Source: Oral (04/04 0800) BP: 141/95 mmHg (04/04 0900) Pulse Rate: 70 (04/04 0900)  Labs:  Recent Labs  08/25/14 1510 08/25/14 1853 08/26/14 0100 08/26/14 0346 08/26/14 0800  HGB 14.4  --   --  14.5  --   HCT 42.9  --   --  41.1  --   PLT 108*  --   --  103*  --   APTT  --   --   --   --  56*  CREATININE 1.21  --   --  0.98  --   TROPONINI  --  0.07* 0.12*  --  0.10*    Estimated Creatinine Clearance: 83.1 mL/min (by C-G formula based on Cr of 0.98).   Medical History: Past Medical History  Diagnosis Date  . Hypertension   . ETOH abuse   . Seizures     ETOH related  . A-fib     Medications:  Infusions:  . 0.9 % NaCl with KCl 40 mEq / L 100 mL/hr (08/26/14 0755)  . bivalirudin (ANGIOMAX) infusion 0.5 mg/mL (Non-ACS indications) 0.15 mg/kg/hr (08/26/14 0640)  . diltiazem (CARDIZEM) infusion 15 mg/hr (08/26/14 0116)    Assessment: [age] yoM admitted with alcohol withdrawal seizure found to be in afib with RVR and now elevated troponins.  Bivalirudin ordered for r/o ACS since patient has pork allergy and liver dysfunction.  First aPTT therapeutic at 56 seconds. Bivalirudin infusing at 0.15 mg/kg/hr. Note recommended that first aPTT be drawn 2 hours after starting infusion but first level was drawn a bit early.  Hgb WNL.  Platelets low, h/o ETOH abuse.  No bleeding/complications reported.  Goal of Therapy:  aPTT 50-85 seconds Monitor platelets by anticoagulation protocol: Yes   Plan:  1.  Continue bivalirudin infusion at 0.15mg /kg/hr.  Cards recommends continuing anticoagulation for now. 2.   Repeat aPTT in 4 hours from first level (at noon).  Clance BollRunyon, Kamorie Aldous 08/26/2014,9:39 AM  Addendum: 08/26/2014 1:23 PM Repeat aPTT therapeutic at 57 seconds.  Levels now have been therapeutic x 2 at current infusion rate.   1.  Continue bivalirudin infusion at 0.15mg /kg/hr. 2.  Daily aPTT. 3.  F/u CBC and SCr ordered for AM.  Clance BollAmanda Sigmond Patalano, PharmD, BCPS Pager: [phone] 08/26/2014 1:30 PM

## 2014-08-26 NOTE — Progress Notes (Addendum)
Shift Event: RN states pt is going between aflutter and afib on monitor.  EKG obtained shows acute STEMI. At bedside, pt is A and O X4, denies any cp, sob n,v, or diaphoresis.  Record reviewed, troponin has increased from 0.07 to 0.12. Placed pt on Bivalirudin per pharmacy (cant use Heparin due to pork allergy), BB, statin, and pt already on ASA. Care discussed with Dr. Joseph ArtWoods who suggests findings more consistent with demand ischemia vs true ACS. He agrees with current plan and deferred cardiology consult until am.   Illa LevelSahar Edwardo Wojnarowski [facility] [phone]

## 2014-08-26 NOTE — Progress Notes (Signed)
Progress Note   Benjamin Blackwell ZOX:[id number] DOB: Jun 10, 1950 DOA: 08/25/2014 PCP: No primary care provider on file.   Brief Narrative:   Benjamin Blackwell is an 64 y.o. male a PMH of alcohol dependency who was admitted 08/25/14 after suffering from an alcohol withdrawal seizure. On initial presentation, the patient was found to be in atrial fibrillation with RVR.  Assessment/Plan:   Principal Problem:  Alcohol withdrawal seizure / Alcohol dependency - Continue seizure precautions. - Continue to detox with Ativan per CIWA protocol. - Supplementing thiamine, folic acid.  Active Problems:   Metabolic acidosis - Likely from elevated lactate levels from seizure activity. Resolved.   Atrial fibrillation/flutter with RVR / demand ischemia - TSH WNL.   Currently in atrial flutter on monitor. - Mild elevation of troponins noted along with ST elevations in the inferior leads on repeat 12-lead EKG, likely demand ischemia. We'll get 2-D echo. - Heart rate 79-106 on Cardizem drip for rate control. - Continue aspirin. Now on bivalirudin and metoprolol as needed. - Cardiomegaly noted on CXR. - Cardiology consultation requested.   Hypertension - Continue Cardizem and as needed metoprolol.   Hypokalemia  - Replete. Check magnesium.    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    Hyperglycemia - Follow-up hemoglobin A1c.   Thrombocytopenia - Likely from toxic effects of ETOH on bone marrow. - Avoid heparin/Lovenox (has pork allergy).   DVT prophylaxis - SCDs ordered.  Code Status: Full. Family Communication: Daughter Garvin Fila [phone]; Significant other: Corrie Dandy French [phone] Disposition Plan: Home when stable.   IV Access:    Peripheral IV   Procedures and diagnostic studies:   Ct Head Wo Contrast 08/25/2014: 1. 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 No significant mass effect to  suggest acute or subacute process. 2.  Cerebral atrophy and small vessel ischemic change.   Dg Chest Port 1 View 08/25/2014: Moderate cardiomegaly with central vascular congestion but no focal acute finding.     Medical Consultants:    Cardiology  Anti-Infectives:    None.  Subjective:   [name] denies chest pain or shortness of breath.  He is tremulous.  No nausea or vomiting.  Objective:    Filed Vitals:   08/26/14 0100 08/26/14 0200 08/26/14 0300 08/26/14 0400  BP: 150/94 152/87 166/102 177/106  Pulse: 79 92 91 106  Temp:      TempSrc:      Resp: 26 30 35 36  Height:      Weight:      SpO2: 97% 97% 98% 98%    Intake/Output Summary (Last 24 hours) at 08/26/14 0702 Last data filed at 08/26/14 0600  Gross per 24 hour  Intake 1713.87 ml  Output    500 ml  Net 1213.87 ml    Exam: Gen:  NAD, tremulous Cardiovascular:  Regular, atrial flutter on monitor Respiratory:  Lungs CTAB Gastrointestinal:  Abdomen soft, NT/ND, + BS Extremities:  No C/E/C, PAS hoses on   Data Reviewed:    Labs: Basic Metabolic Panel:  Recent Labs Lab 08/25/14 1510 08/26/14 0346  NA 143 143  K 4.1 3.2*  CL 105 106  CO2 13* 24  GLUCOSE 190* 121*  BUN 7 7  CREATININE 1.21 0.98  CALCIUM 9.3 9.1   GFR Estimated Creatinine Clearance: 83.1 mL/min (by C-G formula based on Cr of 0.98). Liver Function Tests:  Recent Labs Lab 08/25/14 1853  AST 280*  ALT 191*  ALKPHOS 70  BILITOT 1.7*  PROT 9.0*  ALBUMIN 4.5   CBC:  Recent Labs Lab 08/25/14 1510 08/26/14 0346  WBC 3.5* 6.0  HGB 14.4 14.5  HCT 42.9 41.1  MCV 99.5 96.3  PLT 108* 103*   Cardiac Enzymes:  Recent Labs Lab 08/25/14 1853 08/26/14 0100  TROPONINI 0.07* 0.12*   CBG:  Recent Labs Lab 08/25/14 1445  GLUCAP 178*   Thyroid function studies:  Recent Labs  08/26/14 0100  TSH 0.746   Sepsis Labs:  Recent Labs Lab 08/25/14 1510 08/25/14 1855 08/26/14 0346  WBC 3.5*  --  6.0    LATICACIDVEN  --  2.4* 1.1   Microbiology Recent Results (from the past 240 hour(s))  MRSA PCR Screening     Status: None   Collection Time: 08/25/14  6:40 PM  Result Value Ref Range Status   MRSA by PCR NEGATIVE NEGATIVE Final    Comment:        The GeneXpert MRSA Assay (FDA approved for NASAL specimens only), is one component of a comprehensive MRSA colonization surveillance program. It is not intended to diagnose MRSA infection nor to guide or monitor treatment for MRSA infections.      Medications:   . aspirin EC  81 mg Oral Daily  . atorvastatin  10 mg Oral q1800  . folic acid  1 mg Oral Daily  . LORazepam  0-4 mg Intravenous Q6H   Followed by  . [START ON 08/27/2014] LORazepam  0-4 mg Intravenous Q12H  . multivitamin with minerals  1 tablet Oral Daily  . thiamine  100 mg Oral Daily   Or  . thiamine  100 mg Intravenous Daily   Continuous Infusions: . sodium chloride 75 mL/hr at 08/25/14 1918  . bivalirudin (ANGIOMAX) infusion 0.5 mg/mL (Non-ACS indications) 0.15 mg/kg/hr (08/26/14 0640)  . diltiazem (CARDIZEM) infusion 15 mg/hr (08/26/14 0116)    Time spent: 35 minutes.  The patient is medically complex and requires high complexity decision making.    LOS: 1 day   RAMA,CHRISTINA  [facility] Pager [phone]. If unable to reach me by pager, please call my cell phone at [phone].  *Please refer to [email or web address], password [email or web address] to get updated schedule on who will round on this patient, as hospitalists switch teams weekly. If 7PM-7AM, please contact night-coverage at [email or web address], password [email or web address] for any overnight needs.  08/26/2014, 7:02 AM

## 2014-08-26 NOTE — Progress Notes (Signed)
ANTICOAGULATION CONSULT NOTE - Initial Consult  Pharmacy Consult for Bivalirudin Indication: chest pain/ACS  Allergies  Allergen Reactions  . Beef-Derived Products Hives  . Pork-Derived Products Hives    Patient Measurements: Height: 5\' 8"  (172.7 cm) Weight: 188 lb 7.9 oz (85.5 kg) IBW/kg (Calculated) : 68.4 Heparin Dosing Weight:   Vital Signs: Temp: 100.7 F (38.2 C) (04/04 0000) Temp Source: Oral (04/04 0000) BP: 177/106 mmHg (04/04 0400) Pulse Rate: 106 (04/04 0400)  Labs:  Recent Labs  08/25/14 1510 08/25/14 1853 08/26/14 0100 08/26/14 0346  HGB 14.4  --   --  14.5  HCT 42.9  --   --  41.1  PLT 108*  --   --  103*  CREATININE 1.21  --   --  0.98  TROPONINI  --  0.07* 0.12*  --     Estimated Creatinine Clearance: 83.1 mL/min (by C-G formula based on Cr of 0.98).   Medical History: Past Medical History  Diagnosis Date  . Hypertension   . ETOH abuse   . Seizures     ETOH related  . A-fib     Medications:  Infusions:  . sodium chloride 75 mL/hr at 08/25/14 1918  . bivalirudin (ANGIOMAX) infusion 0.5 mg/mL (Non-ACS indications)    . diltiazem (CARDIZEM) infusion 15 mg/hr (08/26/14 0116)    Assessment: Patient with afib and now r/o ACS.  Patient with pork allergy and liver dysfunction (AST/ALT >100).  Spoke with night mid-level Camila Lisman, agree to change to bilvalirudin from heparin.  Goal of Therapy:  aPTT 50-85 seconds Monitor platelets by anticoagulation protocol: Yes   Plan:  Bivalirudin infusion at 0.15mg /kg/hr PTT 2hr after infusion start.  Darlina GuysGrimsley Jr, Jacquenette ShoneJulian Crowford 08/26/2014,5:50 AM

## 2014-08-26 NOTE — Care Management (Signed)
CARE MANAGEMENT NOTE 08/26/2014  Patient:  Benjamin Blackwell,Benjamin Blackwell   Account Number:  [id number]  Date Initiated:  08/26/2014  Documentation initiated by:  DAVIS,RHONDA  Subjective/Objective Assessment:   etoh abuse with seizures/a,fib with rvr     Action/Plan:   home when stable   Anticipated DC Date:  08/29/2014   Anticipated DC Plan:  HOME/SELF CARE  In-house referral  Clinical Social Worker  Artistinancial Counselor      DC Planning Services  CM consult      Choice offered to / List presented to:             Status of service:  In process, will continue to follow Medicare Important Message given?   (If response is "NO", the following Medicare IM given date fields will be blank) Date Medicare IM given:   Medicare IM given by:   Date Additional Medicare IM given:   Additional Medicare IM given by:    Discharge Disposition:    Per UR Regulation:  Reviewed for med. necessity/level of care/duration of stay  If discussed at Long Length of Stay Meetings, dates discussed:    Comments:  [id number]/Rhonda Earlene Plateravis, RN, BSN, CCN: [phone]/[id number] Case management. Chart reviewed for discharge planning and present needs. Discharge needs: none present at time of review.

## 2014-08-26 NOTE — Consult Note (Addendum)
Patient ID: Sevyn Paredez MRN: [id number] DOB/AGE: Oct 05, 1950 64 y.o.  Admit date: 08/25/2014 Referring Physician: Rama Primary Cardiologist: New Reason for Consultation: Atrial fibrillatin/flutter  HPI: 64 yo male with history of HTN, alcohol abuse, seizure disorder (etoh related) admitted with altered mental status after a seizure. He is visiting in town for a wedding and after reportedly drinking heavily for the last month, had not had an alcoholic beverage yesterday prior to the seizure. He was noted to be in atrial fibrillation with RVR upon arrival to the ED with HR of 130 bpm. Subtle elevation in troponin with flat trend and peak of 0.12. EKG this am shows atrial flutter with rate 95 bpm.   He is having no chest pain or SOB. No complaints this am.   Tele: atrial flutter, 68 bpm. Reviewed by me   Past Medical History  Diagnosis Date  . Hypertension   . ETOH abuse   . Seizures     ETOH related  . A-fib     Family History  Problem Relation Age of Onset  . Heart attack Mother   . Heart attack Father   . Heart attack Brother   . Heart attack Brother   . Diabetes Father   . Diabetes Brother   . Diabetes Sister     History   Social History  . Marital Status: Divorced    Spouse Name: N/A  . Number of Children: 1  . Years of Education: N/A   Occupational History  . Laborer    Social History Main Topics  . Smoking status: Never Smoker   . Smokeless tobacco: Not on file  . Alcohol Use: 0.0 oz/week    0 Standard drinks or equivalent per week     Comment: Daily, 12 pack a day, sometimes more, occasional liquor  . Drug Use: Yes     Comment: Marijuana  . Sexual Activity: Not on file   Other Topics Concern  . Not on file   Social History Narrative   Lives with his common law girlfriend.  Employed full time as a Nature conservation officer.      Past Surgical History  Procedure Laterality Date  . Shoulder surgery      left shoulder    Allergies  Allergen Reactions  .  Beef-Derived Products Hives  . Pork-Derived [facility] Medications:  . aspirin EC  81 mg Oral Daily  . atorvastatin  10 mg Oral q1800  . folic acid  1 mg Oral Daily  . LORazepam  0-4 mg Intravenous Q6H   Followed by  . [START ON 08/27/2014] LORazepam  0-4 mg Intravenous Q12H  . multivitamin with minerals  1 tablet Oral Daily  . thiamine  100 mg Oral Daily   Or  . thiamine  100 mg Intravenous Daily    Prior to Admission medications   Medication Sig Start Date End Date Taking? Authorizing Provider  PRESCRIPTION MEDICATION Patient is supposed to be taking medication for High Blood Pressure but has not taken it in about 5 years according to daughter.   Yes Historical Provider, MD    Review of systems complete and found to be negative unless listed above   Physical Exam: Blood pressure 141/95, pulse 70, temperature 98.7 F (37.1 C), temperature source Oral, resp. rate 28, height  (1.727 m), weight 188 lb 7.9 oz (85.5 kg), SpO2 99 %.    General: Well developed, well nourished, NAD  HEENT: OP clear, mucus membranes moist  SKIN: warm, dry. No rashes.  Neuro: No focal deficits  Musculoskeletal: Muscle strength 5/5 all ext  Psychiatric: Mood and affect normal  Neck: No JVD, no carotid bruits, no thyromegaly, no lymphadenopathy.  Lungs:Clear bilaterally, no wheezes, rhonci, crackles  Cardiovascular: Irregular rate and rhythm. No murmurs, gallops or rubs.  Abdomen:Soft. Bowel sounds present. Non-tender.  Extremities: No lower extremity edema. Pulses are 2 + in the bilateral DP/PT.  Labs:   Lab Results  Component Value Date   WBC 6.0 08/26/2014   HGB 14.5 08/26/2014   HCT 41.1 08/26/2014   MCV 96.3 08/26/2014   PLT 103* 08/26/2014     Recent Labs Lab 08/25/14 1853 08/26/14 0346  NA  --  143  K  --  3.2*  CL  --  106  CO2  --  24  BUN  --  7  CREATININE  --  0.98  CALCIUM  --  9.1  PROT 9.0*  --   BILITOT 1.7*  --   ALKPHOS 70  --   ALT 191*  --     AST 280*  --   GLUCOSE  --  121*   Lab Results  Component Value Date   TROPONINI 0.10* 08/26/2014     Chest x-ray 08/25/14: The heart size is moderately enlarged without evidence for edema. Cardiac leads obscure detail. Both lungs are clear. The visualized skeletal structures are unremarkable.  IMPRESSION: Moderate cardiomegaly with central vascular congestion but no focal acute finding.  EKG: Atrial flutter, rate 95 bpm. ST abnormalities. (I have personally reviewed)  ASSESSMENT AND PLAN:   1. Atrial fibrillation/flutter: Likely due to withdrawal from alcohol. Alcohol withdrawal protocol in place per primary team. Now in atrial flutter with controlled ventricular rate. Echo is pending per primary team. Troponin with subtle elevation and flat trend, peak 0.12. EKG with subtle ST changes. He is being anti-coagulated with bivalirudin given his allergy to pork products. CHADSVASC score 1 (Stroke risk was 0.6% per year in >90,000 patients (the [place]rial Fibrillation Cohort Study) and 0.9% risk of stroke/TIA/systemic embolism). Since this is likely due to his withdrawal from alcohol, may not need long term anti-coagulation if LVEF is ok.   -Agree with obtaining echo -Continue cardizem drip for rate control while he is going through withdrawal.  -Follow serial EKG and troponin -Replace Potassium and Magnesium -Continue anti-coagulation for now  2. Elevated troponin: Likely demand ischemia with elevated ventricular rate. He has subtle EKG changes but no chest pain or SOB.   -Cycle cardiac markers.    We will follow with you. Thanks for the consult.    Signed: Verne Carrowhristopher McAlhany, MD 08/26/2014, 9:25 AM

## 2014-08-26 NOTE — Progress Notes (Signed)
INITIAL NUTRITION ASSESSMENT  DOCUMENTATION CODES Per approved criteria  -Not Applicable   INTERVENTION: - Ensure Enlive po PRN- If pt eats <50% of meals, each supplement provides 350 kcal and 20 grams of protein - Multivitamin with minerals daily  NUTRITION DIAGNOSIS: Increased nutrient needs related to ETOH dependency as evidenced by reported poor po intake.   Goal: Pt to meet >/= 90% of their estimated nutrition needs   Monitor:  Weight trend, po intake, acceptance of supplements, labs  Reason for Assessment: Malnutrition Screening Tool  64 y.o. male  Admitting Dx: Alcohol withdrawal seizure  ASSESSMENT: 64 y.o. male with a PMH of alcoholism who is visiting out of town to attend a Wedding and who was noted to be tremulous all day after not drinking any alcohol this morning. While eating out, the patient was noted to be diaphoretic, seemed altered. The patient's daughter says that he did not respond to her calling his name, and a short time later had a witnessed tonic-clonic seizure.  - Per RN, pt is eating. Pt reports that he had applesauce this morning and is hungry for lunch today. He said that he was not eating very well prior to admission.  - Current diet is regular. No recent weight loss.  - RD to order PRN supplements.  - No signs of fat or muscle depletion.  - Labs and medications reviewed. K low 3.2  Height: Ht Readings from Last 1 Encounters:  08/25/14  (1.727 m)    Weight: Wt Readings from Last 1 Encounters:  08/25/14 188 lb 7.9 oz (85.5 kg)    Ideal Body Weight: 68.4 kg  % Ideal Body Weight: 125%  Wt Readings from Last 10 Encounters:  08/25/14 188 lb 7.9 oz (85.5 kg)    Usual Body Weight: unknown  % Usual Body Weight: n/a  BMI:  Body mass index is 28.67 kg/(m^2).  Estimated Nutritional Needs: Kcal: 2100-2300 Protein: 100-120 g Fluid: 2.1-2.3 L/day  Skin: Intact  Diet Order: Diet regular Room service appropriate?: Yes; Fluid  consistency:: Thin  EDUCATION NEEDS: -Education needs addressed   Intake/Output Summary (Last 24 hours) at 08/26/14 1254 Last data filed at 08/26/14 1200  Gross per 24 hour  Intake 2618.31 ml  Output   1075 ml  Net 1543.31 ml    Last BM: prior to admission   Labs:   Recent Labs Lab 08/25/14 1510 08/26/14 0346  NA 143 143  K 4.1 3.2*  CL 105 106  CO2 13* 24  BUN 7 7  CREATININE 1.21 0.98  CALCIUM 9.3 9.1  MG  --  2.0  GLUCOSE 190* 121*    CBG (last 3)   Recent Labs  08/25/14 1445  GLUCAP 178*    Scheduled Meds: . aspirin EC  81 mg Oral Daily  . atorvastatin  10 mg Oral q1800  . folic acid  1 mg Oral Daily  . LORazepam  0-4 mg Intravenous Q6H   Followed by  . [START ON 08/27/2014] LORazepam  0-4 mg Intravenous Q12H  . multivitamin with minerals  1 tablet Oral Daily  . thiamine  100 mg Oral Daily   Or  . thiamine  100 mg Intravenous Daily    Continuous Infusions: . 0.9 % NaCl with KCl 40 mEq / L 100 mL/hr (08/26/14 0755)  . bivalirudin (ANGIOMAX) infusion 0.5 mg/mL (Non-ACS indications) 0.15 mg/kg/hr (08/26/14 0640)  . diltiazem (CARDIZEM) infusion 12.5 mg/hr (08/26/14 1137)    Past Medical History  Diagnosis Date  .  Hypertension   . ETOH abuse   . Seizures     ETOH related  . A-fib     Past Surgical History  Procedure Laterality Date  . Shoulder surgery      left shoulder    Emmaline KluverHaley Toney Difatta MS, RD, LDN [phone]

## 2014-08-27 DIAGNOSIS — I10 Essential (primary) hypertension: Secondary | ICD-10-CM

## 2014-08-27 DIAGNOSIS — K7010 Alcoholic hepatitis without ascites: Secondary | ICD-10-CM | POA: Diagnosis present

## 2014-08-27 DIAGNOSIS — I4891 Unspecified atrial fibrillation: Secondary | ICD-10-CM

## 2014-08-27 HISTORY — PX: TRANSTHORACIC ECHOCARDIOGRAM: SHX275

## 2014-08-27 LAB — HEMOGLOBIN A1C
HEMOGLOBIN A1C: 5.8 % — AB (ref 4.8–5.6)
Mean Plasma Glucose: 120 mg/dL

## 2014-08-27 LAB — COMPREHENSIVE METABOLIC PANEL
ALT: 773 U/L — ABNORMAL HIGH (ref 0–53)
AST: 1338 U/L — ABNORMAL HIGH (ref 0–37)
Albumin: 3.6 g/dL (ref 3.5–5.2)
Alkaline Phosphatase: 58 U/L (ref 39–117)
Anion gap: 8 (ref 5–15)
BUN: 7 mg/dL (ref 6–23)
CALCIUM: 8.8 mg/dL (ref 8.4–10.5)
CO2: 25 mmol/L (ref 19–32)
CREATININE: 0.92 mg/dL (ref 0.50–1.35)
Chloride: 106 mmol/L (ref 96–112)
GFR calc Af Amer: >90 mL/min (ref >90)
GFR calc non Af Amer: 89 mL/min — ABNORMAL LOW (ref >90)
GLUCOSE: 122 mg/dL — AB (ref 70–99)
Potassium: 3.6 mmol/L (ref 3.5–5.1)
Sodium: 139 mmol/L (ref 135–145)
TOTAL PROTEIN: 7.5 g/dL (ref 6.0–8.3)
Total Bilirubin: 1.6 mg/dL — ABNORMAL HIGH (ref 0.3–1.2)

## 2014-08-27 LAB — BRAIN NATRIURETIC PEPTIDE: B Natriuretic Peptide: 262.4 pg/mL — ABNORMAL HIGH (ref 0.0–100.0)

## 2014-08-27 LAB — CBC
HEMATOCRIT: 41 % (ref 39.0–52.0)
Hemoglobin: 14 g/dL (ref 13.0–17.0)
MCH: 33.5 pg (ref 26.0–34.0)
MCHC: 34.1 g/dL (ref 30.0–36.0)
MCV: 98.1 fL (ref 78.0–100.0)
Platelets: 110 10*3/uL — ABNORMAL LOW (ref 150–400)
RBC: 4.18 MIL/uL — AB (ref 4.22–5.81)
RDW: 13.1 % (ref 11.5–15.5)
WBC: 5.2 10*3/uL (ref 4.0–10.5)

## 2014-08-27 LAB — PROTIME-INR
INR: 1.54 — ABNORMAL HIGH (ref 0.00–1.49)
Prothrombin Time: 18.6 seconds — ABNORMAL HIGH (ref 11.6–15.2)

## 2014-08-27 LAB — APTT: APTT: 59 s — AB (ref 24–37)

## 2014-08-27 MED ORDER — METOPROLOL TARTRATE 25 MG PO TABS
25.0000 mg | ORAL_TABLET | Freq: Four times a day (QID) | ORAL | Status: DC
Start: 2014-08-27 — End: 2014-08-29
  Administered 2014-08-27 – 2014-08-28 (×6): 25 mg via ORAL
  Filled 2014-08-27 (×6): qty 1

## 2014-08-27 MED ORDER — PREDNISOLONE 5 MG PO TABS
40.0000 mg | ORAL_TABLET | Freq: Every day | ORAL | Status: DC
  Filled 2014-08-27: qty 8

## 2014-08-27 MED ORDER — METHYLPREDNISOLONE SODIUM SUCC 40 MG IJ SOLR: 40.0000 mg | Freq: Every day | INTRAMUSCULAR | Status: DC

## 2014-08-27 MED ORDER — DILTIAZEM HCL ER COATED BEADS 240 MG PO CP24
240.0000 mg | ORAL_CAPSULE | Freq: Every day | ORAL | Status: DC
  Administered 2014-08-27 – 2014-08-28 (×2): 240 mg via ORAL
  Filled 2014-08-27 (×2): qty 2

## 2014-08-27 MED ORDER — CLONIDINE HCL 0.1 MG PO TABS
0.1000 mg | ORAL_TABLET | Freq: Two times a day (BID) | ORAL | Status: DC
  Administered 2014-08-27 – 2014-08-28 (×3): 0.1 mg via ORAL
  Filled 2014-08-27 (×3): qty 1

## 2014-08-27 MED ORDER — METOPROLOL TARTRATE 25 MG PO TABS
25.0000 mg | ORAL_TABLET | Freq: Two times a day (BID) | ORAL | Status: DC
  Administered 2014-08-27: 25 mg via ORAL
  Filled 2014-08-27: qty 1

## 2014-08-27 MED ORDER — PREDNISOLONE 5 MG PO TABS
40.0000 mg | ORAL_TABLET | Freq: Every day | ORAL | Status: AC
  Administered 2014-08-27 – 2014-09-01 (×6): 40 mg via ORAL
  Filled 2014-08-27 (×6): qty 8

## 2014-08-27 MED ORDER — PREDNISONE 20 MG PO TABS: 40.0000 mg | ORAL_TABLET | Freq: Every day | ORAL | Status: DC

## 2014-08-27 MED ORDER — PANTOPRAZOLE SODIUM 40 MG PO TBEC
40.0000 mg | DELAYED_RELEASE_TABLET | Freq: Every day | ORAL | Status: DC
  Administered 2014-08-27 – 2014-09-08 (×11): 40 mg via ORAL
  Filled 2014-08-27 (×11): qty 1

## 2014-08-27 NOTE — Progress Notes (Signed)
ANTICOAGULATION CONSULT NOTE - Follow Up Consult  Pharmacy Consult for Bivalirudin Indication: chest pain/ACS  Allergies  Allergen Reactions  . Beef-Derived Products Hives  . Pork-Derived Products Hives    Patient Measurements: Height: 5\' 8"  (172.7 cm) Weight: 195 lb 12.3 oz (88.8 kg) IBW/kg (Calculated) : 68.4  Vital Signs: Temp: 98.3 F (36.8 C) (04/05 0800) Temp Source: Oral (04/05 0800) BP: 143/83 mmHg (04/05 0643) Pulse Rate: 75 (04/05 0643)  Labs:  Recent Labs  08/25/14 1510 08/25/14 1853 08/26/14 0100 08/26/14 0346 08/26/14 0800 08/26/14 1219 08/27/14 0330  HGB 14.4  --   --  14.5  --   --  14.0  HCT 42.9  --   --  41.1  --   --  41.0  PLT 108*  --   --  103*  --   --  110*  APTT  --   --   --   --  56* 57* 59*  LABPROT  --   --   --   --   --   --  18.6*  INR  --   --   --   --   --   --  1.54*  CREATININE 1.21  --   --  0.98  --   --  0.92  TROPONINI  --  0.07* 0.12*  --  0.10*  --   --     Estimated Creatinine Clearance: 90.2 mL/min (by C-G formula based on Cr of 0.92).   Medical History: Past Medical History  Diagnosis Date  . Hypertension   . ETOH abuse   . Seizures     ETOH related  . A-fib     Medications:  Infusions:  . 0.9 % NaCl with KCl 40 mEq / L 100 mL/hr (08/27/14 0354)  . bivalirudin (ANGIOMAX) infusion 0.5 mg/mL (Non-ACS indications) 0.15 mg/kg/hr (08/27/14 0231)  . diltiazem (CARDIZEM) infusion 15 mg/hr (08/26/14 2350)    Assessment: [age] yoM admitted with alcohol withdrawal seizure found to be in afib with RVR and now elevated troponins.  Bivalirudin started 4/4 for r/o ACS since patient has pork allergy and liver dysfunction.   PTT remains therapeutic with bivalirudin infusing at 0.15 mg/kg/hr.  Hgb WNL.  Platelets low/stable, h/o ETOH abuse.    SCr stable.  No bleeding/complications reported.  Goal of Therapy:  aPTT 50-85 seconds Monitor platelets by anticoagulation protocol: Yes   Plan:  1.  Continue  bivalirudin infusion at 0.15mg /kg/hr.   2.  Daily aPTT and CBC. 3.  F/u recommendations from Cards.  Clance Bollunyon, Alyse Kathan 08/27/2014,8:38 AM

## 2014-08-27 NOTE — Progress Notes (Signed)
Clinical Social Work Department BRIEF PSYCHOSOCIAL ASSESSMENT 08/27/2014  Patient:  Benjamin Blackwell, Benjamin Blackwell     Account Number:  [id number]     Admit date:  08/25/2014  Clinical Social Worker:  Maryln Manuel  Date/Time:  08/27/2014 02:30 PM  Referred by:  Physician  Date Referred:  08/27/2014 Referred for  Substance Abuse   Other Referral:   Interview type:  Patient Other interview type:    PSYCHOSOCIAL DATA Living Status:  FAMILY Admitted from facility:   Level of care:   Primary support name:  Silva Bandy Spencer/sister/[phone] Primary support relationship to patient:  SIBLING Degree of support available:   pt reports adequate support    CURRENT CONCERNS Current Concerns  Other - See comment   Other Concerns:   substance abuse    SOCIAL WORK ASSESSMENT / PLAN CSW received referral for current substance abuse.    CSW reviewed chart and noted that pt admitted alcohol withdrawal seizure.    CSW met with pt at bedside. CSW introduced self and explained role. Pt reports that he admitted to the hospital secondary to a seizure. CSW provided support as pt discussed that he was sitting down waiting for dinner and then the next thing he knew he was here in the hospital. Pt endorses having seizures in the past. CSW explored with pt his use of alcohol. Pt reports drinking "a few beers a day" after work with his friends. Pt states that he does not feel that his alcohol use is currently a problem. CSW provided education surrounding alcohol use and it's negative impacts on pt health. Pt shook head in understanding, but withdrawn from conversation. CSW discussed possible resources for alcohol use, but pt did not express interest in resources at this time. CSW notified pt that if he is interested in receiving resources prior to d/c then to notify his RN in order for RN to notify this CSW to provide resources.    CSW to continue to follow for support and to provide resources if pt changes his  mind about receiving resources.   Assessment/plan status:  Psychosocial Support/Ongoing Assessment of Needs Other assessment/ plan:   Information/referral to community resources:   pt declined substance abuse resources    PATIENT'S/FAMILY'S RESPONSE TO PLAN OF CARE: Pt alert and oriented x4. Pt reports that he is beginning to feel better and expressed relief that he was able to eat today as he has not felt like eating since admission. Pt endorses drinking alcohol, but does not identify this as a concern at this time. Education provided and pt agreeable to notify CSW if he is interested in receiving resources-pt declined resources at this time.   Alison Murray, MSW, [facility] [phone]

## 2014-08-27 NOTE — Progress Notes (Addendum)
Progress Note   Benjamin Blackwell ZOX:[id number] DOB: 17-Jan-1951 DOA: 08/25/2014 PCP: No primary care provider on file.   Brief Narrative:   Benjamin Blackwell is an 64 y.o. male a PMH of alcohol dependency who was admitted 08/25/14 after suffering from an alcohol withdrawal seizure. On initial presentation, the patient was found to be in atrial fibrillation with RVR.  Assessment/Plan:   Principal Problem:  Alcohol withdrawal seizure / Alcohol dependency - Continue seizure precautions. - Continue to detox with Ativan per CIWA protocol. - Supplementing thiamine, folic acid.  Active Problems:   Metabolic acidosis - Likely from elevated lactate levels from seizure activity. Resolved.   Atrial fibrillation/flutter with RVR / demand ischemia - TSH WNL.   - Mild elevation of troponins noted along with ST elevations in the inferior leads on repeat 12-lead EKG, likely demand ischemia.  - 2-D echo pending. - Heart rate 60s-80s on Cardizem drip for rate control.  Transition to oral Cardizem and add PO metoprolol. - Continue aspirin. Now on bivalirudin and metoprolol as needed. - Cardiology following.   Hypertension - Continue Cardizem and as needed metoprolol.   Hypokalemia  - Repleted. Magnesium okay.    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 LFTs worse today.  - Protime is 18.6.   Discriminant function is 32.  Start steroids.  Add PPI for GI prophylaxis.    Hyperglycemia - Follow-up hemoglobin A1c.   Thrombocytopenia - Likely from toxic effects of ETOH on bone marrow. - Avoid heparin/Lovenox (has pork allergy).   DVT prophylaxis - SCDs ordered.  Code Status: Full. Family Communication: Daughter Danford Bad updated at bedside 08/26/14. Disposition Plan: Home when stable.   IV Access:    Peripheral IV   Procedures and diagnostic studies:   Ct Head Wo Contrast 08/25/2014: 1. Left occipital lobe hypoattenuation  is favored to represent remote PCA infarct. No significant mass effect to suggest acute or subacute process. 2.  Cerebral atrophy and small vessel ischemic change.   Dg Chest Port 1 View 08/25/2014: Moderate cardiomegaly with central vascular congestion but no focal acute finding.     Medical Consultants:    Kathleene Hazel, MD, Cardiology  Anti-Infectives:    None.  Subjective:   [name] denies chest pain or shortness of breath.  He is less tremulous.  No nausea or vomiting.  Appetite good.  Objective:    Filed Vitals:   08/27/14 0200 08/27/14 0300 08/27/14 0400 08/27/14 0643  BP: 146/105 140/87 155/88 143/83  Pulse: 68 85 73 75  Temp:   98.2 F (36.8 C)   TempSrc:   Oral   Resp: Height:      Weight:   88.8 kg (195 lb 12.3 oz)   SpO2: 91% 96% 98% 97%    Intake/Output Summary (Last 24 hours) at 08/27/14 0700 Last data filed at 08/27/14 0600  Gross per 24 hour  Intake 4038.47 ml  Output   2250 ml  Net 1788.47 ml    Exam: Gen:  NAD Cardiovascular:  HSIR Respiratory:  Lungs CTAB Gastrointestinal:  Abdomen soft, NT/ND, + BS Extremities:  No C/E/C, PAS hoses on   Data Reviewed:    Labs: Basic Metabolic Panel:  Recent Labs Lab 08/25/14 1510 08/26/14 0346 08/27/14 0330  NA 143 143 139  K 4.1 3.2* 3.6  CL 105 106 106  CO2 13* 24 25  GLUCOSE 190* 121* 122*  BUN 7 7 7  CREATININE 1.21 0.98 0.92  CALCIUM 9.3 9.1 8.8  MG  --  2.0  --    GFR Estimated Creatinine Clearance: 90.2 mL/min (by C-G formula based on Cr of 0.92). Liver Function Tests:  Recent Labs Lab 08/25/14 1853 08/27/14 0330  AST 280* 1338*  ALT 191* 773*  ALKPHOS 70 58  BILITOT 1.7* 1.6*  PROT 9.0* 7.5  ALBUMIN 4.5 3.6   Coagulation profile  Recent Labs Lab 08/27/14 0330  INR 1.54*    CBC:  Recent Labs Lab 08/25/14 1510 08/26/14 0346 08/27/14 0330  WBC 3.5* 6.0 5.2  HGB 14.4 14.5 14.0  HCT 42.9 41.1 41.0  MCV 99.5 96.3 98.1  PLT 108*  103* 110*   Cardiac Enzymes:  Recent Labs Lab 08/25/14 1853 08/26/14 0100 08/26/14 0800  TROPONINI 0.07* 0.12* 0.10*   CBG:  Recent Labs Lab 08/25/14 1445  GLUCAP 178*   Hgb A1c  Recent Labs  08/26/14 0100  HGBA1C 5.8*   Thyroid function studies  Recent Labs  08/26/14 0100  TSH 0.746   Microbiology Recent Results (from the past 240 hour(s))  MRSA PCR Screening     Status: None   Collection Time: 08/25/14  6:40 PM  Result Value Ref Range Status   MRSA by PCR NEGATIVE NEGATIVE Final    Comment:        The GeneXpert MRSA Assay (FDA approved for NASAL specimens only), is one component of a comprehensive MRSA colonization surveillance program. It is not intended to diagnose MRSA infection nor to guide or monitor treatment for MRSA infections.      Medications:   . aspirin EC  81 mg Oral Daily  . atorvastatin  10 mg Oral q1800  . folic acid  1 mg Oral Daily  . LORazepam  0-4 mg Intravenous Q6H   Followed by  . LORazepam  0-4 mg Intravenous Q12H  . multivitamin with minerals  1 tablet Oral Daily  . thiamine  100 mg Oral Daily   Or  . thiamine  100 mg Intravenous Daily   Continuous Infusions: . 0.9 % NaCl with KCl 40 mEq / L 100 mL/hr (08/27/14 0354)  . bivalirudin (ANGIOMAX) infusion 0.5 mg/mL (Non-ACS indications) 0.15 mg/kg/hr (08/27/14 0231)  . diltiazem (CARDIZEM) infusion 15 mg/hr (08/26/14 2350)    Time spent: 35 minutes.  The patient is medically complex and requires high complexity decision making.    LOS: 2 days   Elouise Divelbiss  [facility] Pager [phone]. If unable to reach me by pager, please call my cell phone at [phone].  *Please refer to [email or web address], password [email or web address] to get updated schedule on who will round on this patient, as hospitalists switch teams weekly. If 7PM-7AM, please contact night-coverage at [email or web address], password [email or web address] for any overnight needs.  08/27/2014, 7:00 AM

## 2014-08-27 NOTE — Progress Notes (Signed)
Echocardiogram 2D Echocardiogram has been performed.  Benjamin Blackwell, Benjamin Blackwell 08/27/2014, 11:01 AM

## 2014-08-27 NOTE — Progress Notes (Signed)
       Patient Name: Benjamin Blackwell Date of Encounter: 08/27/2014    SUBJECTIVE:The patient is awake and alert. He denies chest pain and dyspnea  TELEMETRY:  A fib/flutter with RVR Filed Vitals:   08/27/14 0900 08/27/14 0904 08/27/14 0907 08/27/14 0915  BP: 198/117 189/102 189/102 189/127  Pulse: 128 82  89  Temp:      TempSrc:      Resp: 22 31  27   Height:      Weight:      SpO2: 96% 97%  99%    Intake/Output Summary (Last 24 hours) at 08/27/14 0933 Last data filed at 08/27/14 0908  Gross per 24 hour  Intake 4179.17 ml  Output   2350 ml  Net 1829.17 ml   LABS: Basic Metabolic Panel:  Recent Labs  21/30/8602/09/07 0346 08/27/14 0330  NA 143 139  K 3.2* 3.6  CL 106 106  CO2 24 25  GLUCOSE 121* 122*  BUN 7 7  CREATININE 0.98 0.92  CALCIUM 9.1 8.8  MG 2.0  --    CBC:  Recent Labs  08/26/14 0346 08/27/14 0330  WBC 6.0 5.2  HGB 14.5 14.0  HCT 41.1 41.0  MCV 96.3 98.1  PLT 103* 110*   Cardiac Enzymes:  Recent Labs  08/25/14 1853 08/26/14 0100 08/26/14 0800  TROPONINI 0.07* 0.12* 0.10*    Hemoglobin A1C:  Recent Labs  08/26/14 0100  HGBA1C 5.8*   Fasting Lipid Panel: No results for input(s): CHOL, HDL, LDLCALC, TRIG, CHOLHDL, LDLDIRECT in the last 72 hours.  Radiology/Studies:  CXR with moderate CM and CHF  Physical Exam: Blood pressure 189/127, pulse 89, temperature 98.3 F (36.8 C), temperature source Oral, resp. rate 27, height 5\' 8"  (1.727 m), weight 195 lb 12.3 oz (88.8 kg), SpO2 99 %. Weight change: 7 lb 4.4 oz (3.3 kg)  Wt Readings from Last 3 Encounters:  08/27/14 195 lb 12.3 oz (88.8 kg)   Chest is clear Cardiac with rapid irregular rate.  ASSESSMENT:  1. Atrial fibrillation with RVR, induced by ETOH 2. Trace troponin is demand related and does not require ischemia workup  Plan:  1. Further increase oral beta blocker to control rate and BP 2. May need diuretic to help BP control 3. Check BNP and echo results. 4. If  systolic dysfunction or marked BNP elevation, add diuretic  Signed, Lesleigh NoeSMITH III,Merwin Breden W 08/27/2014, 9:33 AM

## 2014-08-28 DIAGNOSIS — R7309 Other abnormal glucose: Secondary | ICD-10-CM

## 2014-08-28 DIAGNOSIS — R7303 Prediabetes: Secondary | ICD-10-CM | POA: Diagnosis present

## 2014-08-28 DIAGNOSIS — K7010 Alcoholic hepatitis without ascites: Secondary | ICD-10-CM

## 2014-08-28 DIAGNOSIS — R0602 Shortness of breath: Secondary | ICD-10-CM | POA: Insufficient documentation

## 2014-08-28 DIAGNOSIS — F10239 Alcohol dependence with withdrawal, unspecified: Secondary | ICD-10-CM

## 2014-08-28 LAB — COMPREHENSIVE METABOLIC PANEL
ALBUMIN: 3.6 g/dL (ref 3.5–5.2)
ALK PHOS: 58 U/L (ref 39–117)
ALT: 551 U/L — AB (ref 0–53)
AST: 444 U/L — AB (ref 0–37)
Anion gap: 11 (ref 5–15)
BILIRUBIN TOTAL: 1.3 mg/dL — AB (ref 0.3–1.2)
BUN: 11 mg/dL (ref 6–23)
CHLORIDE: 103 mmol/L (ref 96–112)
CO2: 24 mmol/L (ref 19–32)
Calcium: 9.2 mg/dL (ref 8.4–10.5)
Creatinine, Ser: 0.82 mg/dL (ref 0.50–1.35)
GFR calc Af Amer: >90 mL/min (ref >90)
GFR calc non Af Amer: >90 mL/min (ref >90)
Glucose, Bld: 134 mg/dL — ABNORMAL HIGH (ref 70–99)
Potassium: 3.9 mmol/L (ref 3.5–5.1)
SODIUM: 138 mmol/L (ref 135–145)
TOTAL PROTEIN: 7.8 g/dL (ref 6.0–8.3)

## 2014-08-28 LAB — CBC
HCT: 41.1 % (ref 39.0–52.0)
HEMOGLOBIN: 14.5 g/dL (ref 13.0–17.0)
MCH: 33.8 pg (ref 26.0–34.0)
MCHC: 35.3 g/dL (ref 30.0–36.0)
MCV: 95.8 fL (ref 78.0–100.0)
PLATELETS: 134 10*3/uL — AB (ref 150–400)
RBC: 4.29 MIL/uL (ref 4.22–5.81)
RDW: 12.6 % (ref 11.5–15.5)
WBC: 4.8 10*3/uL (ref 4.0–10.5)

## 2014-08-28 LAB — PROTIME-INR
INR: 1.48 (ref 0.00–1.49)
Prothrombin Time: 18 seconds — ABNORMAL HIGH (ref 11.6–15.2)

## 2014-08-28 LAB — APTT: aPTT: 58 seconds — ABNORMAL HIGH (ref 24–37)

## 2014-08-28 MED ORDER — WARFARIN VIDEO
Freq: Once | Status: DC
  Administered 2014-08-28: 1

## 2014-08-28 MED ORDER — FLUOXETINE HCL 20 MG PO CAPS
20.0000 mg | ORAL_CAPSULE | Freq: Every day | ORAL | Status: DC
  Administered 2014-08-28 – 2014-09-08 (×12): 20 mg via ORAL
  Filled 2014-08-28 (×12): qty 1

## 2014-08-28 MED ORDER — WARFARIN SODIUM 5 MG PO TABS
5.0000 mg | ORAL_TABLET | Freq: Once | ORAL | Status: AC
  Administered 2014-08-28: 5 mg via ORAL
  Filled 2014-08-28: qty 1

## 2014-08-28 MED ORDER — MIRTAZAPINE 15 MG PO TABS
7.5000 mg | ORAL_TABLET | Freq: Every day | ORAL | Status: DC
  Administered 2014-08-28 – 2014-09-07 (×11): 7.5 mg via ORAL
  Filled 2014-08-28 (×11): qty 1

## 2014-08-28 MED ORDER — ASPIRIN 81 MG PO CHEW
81.0000 mg | CHEWABLE_TABLET | Freq: Every day | ORAL | Status: DC
  Administered 2014-08-28 – 2014-08-29 (×2): 81 mg via ORAL
  Filled 2014-08-28 (×2): qty 1

## 2014-08-28 MED ORDER — HYDROCHLOROTHIAZIDE 25 MG PO TABS
25.0000 mg | ORAL_TABLET | Freq: Every day | ORAL | Status: DC
  Administered 2014-08-28 – 2014-09-08 (×12): 25 mg via ORAL
  Filled 2014-08-28 (×12): qty 1

## 2014-08-28 MED ORDER — CLONIDINE HCL 0.1 MG PO TABS
0.2000 mg | ORAL_TABLET | Freq: Two times a day (BID) | ORAL | Status: DC
  Administered 2014-08-28 – 2014-09-08 (×21): 0.2 mg via ORAL
  Filled 2014-08-28 (×25): qty 2

## 2014-08-28 MED ORDER — WARFARIN - PHARMACIST DOSING INPATIENT: Freq: Every day | Status: DC

## 2014-08-28 MED ORDER — COUMADIN BOOK
Freq: Once | Status: AC
  Administered 2014-08-28: 12:00:00
  Filled 2014-08-28: qty 1

## 2014-08-28 NOTE — Progress Notes (Addendum)
ANTICOAGULATION CONSULT NOTE - Follow Up Consult  Pharmacy Consult for Bivalirudin Indication: chest pain/ACS, afib   Allergies  Allergen Reactions  . Beef-Derived Products Hives  . Pork-Derived Products Hives    Patient Measurements: Height: 5\' 8"  (172.7 cm) Weight: 192 lb 10.9 oz (87.4 kg) IBW/kg (Calculated) : 68.4  Vital Signs: Temp: 98.2 F (36.8 C) (04/06 0400) Temp Source: Oral (04/06 0400) BP: 165/112 mmHg (04/06 0600) Pulse Rate: 66 (04/06 0600)  Labs:  Recent Labs  08/25/14 1853 08/26/14 0100 08/26/14 0346  08/26/14 0800 08/26/14 1219 08/27/14 0330 08/28/14 0333  HGB  --   --  14.5  --   --   --  14.0 14.5  HCT  --   --  41.1  --   --   --  41.0 41.1  PLT  --   --  103*  --   --   --  110* 134*  APTT  --   --   --   < > 56* 57* 59* 58*  LABPROT  --   --   --   --   --   --  18.6* 18.0*  INR  --   --   --   --   --   --  1.54* 1.48  CREATININE  --   --  0.98  --   --   --  0.92 0.82  TROPONINI 0.07* 0.12*  --   --  0.10*  --   --   --   < > = values in this interval not displayed.  Estimated Creatinine Clearance: 100.4 mL/min (by C-G formula based on Cr of 0.82).   Medical History: Past Medical History  Diagnosis Date  . Hypertension   . ETOH abuse   . Seizures     ETOH related  . A-fib     Medications:  Infusions:  . bivalirudin (ANGIOMAX) infusion 0.5 mg/mL (Non-ACS indications) 0.15 mg/kg/hr (08/27/14 2332)    Assessment: [age] yoM admitted with alcohol withdrawal seizure found to be in afib with RVR and elevated troponins.  Bivalirudin started 4/4 for r/o ACS since patient has pork allergy and liver dysfunction.     PTT remains therapeutic with bivalirudin infusing at 0.15 mg/kg/hr.  Hgb WNL.  Platelets low, improved today.  H/o ETOH abuse.    SCr stable.  No bleeding/complications reported.  Goal of Therapy:  aPTT 50-85 seconds Monitor platelets by anticoagulation protocol: Yes   Plan:  1.  Continue bivalirudin infusion at  0.15mg /kg/hr.   2.  Daily aPTT and CBC. 3.  F/u recommendations from Cards.  Clance Bollunyon, Ameia Morency 08/28/2014,7:05 AM   Addendum: Bivalirudin discontinued.  Aspirin added for atrial fibrillation (CHADS = 1).    Clance BollAmanda Alvin Rubano, PharmD, BCPS Pager: [phone] 08/28/2014 11:28 AM

## 2014-08-28 NOTE — Consult Note (Signed)
[facility] Consult   Reason for Consult:  Alcohol withdrawal with seizure, alcoholic hepatitis Referring Physician:  Dr. Rockne Menghini Patient Identification: Benjamin Blackwell MRN:  [id number] Principal Diagnosis: Alcohol withdrawal seizure Diagnosis:   Patient Active Problem List   Diagnosis Date Noted  . Prediabetes [R73.09] 08/28/2014  . SOB (shortness of breath) [R06.02]   . Alcoholic hepatitis [M35.59] 08/27/2014  . Hypokalemia [E87.6] 08/26/2014  . Elevated LFTs [R79.89] 08/26/2014  . Metabolic acidosis [R41.6] 08/26/2014  . Atrial fibrillation with RVR [I48.91] 08/25/2014  . Alcohol withdrawal seizure [F10.239] 08/25/2014  . Alcohol dependency [F10.20] 08/25/2014  . Hypertension [I10] 08/25/2014  . Hyperglycemia [R73.9] 08/25/2014  . Thrombocytopenia [D69.6] 08/25/2014    Total Time spent with patient: 1 hour  Subjective:   Benjamin Blackwell is a 64 y.o. male patient admitted with status post alcohol withdrawal seizures.  HPI:  Benjamin Blackwell is an 64 y.o. male seen, chart reviewed and case discussed with the Dr. Rockne Menghini and psychiatric social service. Patient reported he has been depressed secondary to losing his mother and father when he was young. Patient reportedly dropped out of school and started working as a teenager. Patient married and has a child who is 68 years old. Patient reportedly never received medication management for depression or counseling. Patient denies current symptoms of suicidal, homicidal ideation, intention or plans. Patient reportedly drinking mostly 3 beer before going to the work and 6 pack after work since he was 64 years old. Patient has a previous of sober one time about 7 days and other time about 6 months. Patient was involved with DWI twice and was forced 4 substance abuse classes in the past. 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 Patient reportedly visiting [place] to attend a nephew wedding and then filed down with the alcohol withdrawal seizure. Patient reported he has a past history of alcohol withdrawal seizures 2. Patient stated he promised his wife is going to quit for good now and willing to receive substance abuse rehabilitation treatment upon discharge from the hospital in his county. Patient reported his wife is supportive to him. Patient is willing to try antidepressant medication for depression and insomnia.   Medical history: Patient with history of alcoholism who is visiting out of town to attend a Wedding and who was noted to be tremulous all day after not drinking any alcohol this morning. While eating out, the patient was noted to be diaphoretic, seemed altered. The patient's daughter says that he did not respond to her calling his name, and a short time later had a witnessed tonic-clonic seizure. EMS was called and he was brought to the ED for further evaluation. The patient has had seizures in the past related to alcohol withdrawal, the last one in January. The patient's daughter tells me that he has been drinking especially heavy over the past month.  HPI Elements:   Location:  Alcohol dependence and withdrawal symptoms. Quality:  Poor to fair. Severity:  Moderate symptoms of depression. Timing:  Withdrawal seizure. Duration:  Few days. Context:  Psychomotor retardation and psychosocial stresses.  Past Medical History:  Past Medical History  Diagnosis Date  . Hypertension   . ETOH abuse   . Seizures     ETOH related  . A-fib     Past Surgical History  Procedure Laterality Date  . Shoulder surgery  left shoulder   Family History:  Family History  Problem Relation Age of Onset  . Heart attack Mother   . Heart attack Father   . Heart attack Brother   . Heart attack Brother   . Diabetes Father   . Diabetes Brother   . Diabetes Sister    Social History:   History  Alcohol Use  . 0.0 oz/week  . 0 Standard drinks or equivalent per week    Comment: Daily, 12 pack a day, sometimes more, occasional liquor     History  Drug Use  . Yes    Comment: Marijuana    History   Social History  . Marital Status: Divorced    Spouse Name: N/A  . Number of Children: 1  . Years of Education: N/A   Occupational History  . Laborer    Social History Main Topics  . Smoking status: Never Smoker   . Smokeless tobacco: Not on file  . Alcohol Use: 0.0 oz/week    0 Standard drinks or equivalent per week     Comment: Daily, 12 pack a day, sometimes more, occasional liquor  . Drug Use: Yes     Comment: Marijuana  . Sexual Activity: Not on file   Other Topics Concern  . None   Social History Narrative   Lives with his common law girlfriend.  Employed full time as a Arts administrator.     Additional Social History:                          Allergies:   Allergies  Allergen Reactions  . Beef-Derived Products Hives  . Pork-Derived [employer]:  Results for orders placed or performed during the hospital encounter of 08/25/14 (from the past 48 hour(s))  APTT     Status: Abnormal   Collection Time: 08/26/14 12:19 PM  Result Value Ref Range   aPTT 57 (H) 24 - 37 seconds    Comment:        IF BASELINE aPTT IS ELEVATED, SUGGEST PATIENT RISK ASSESSMENT BE USED TO DETERMINE APPROPRIATE ANTICOAGULANT THERAPY.   Comprehensive metabolic panel     Status: Abnormal   Collection Time: 08/27/14  3:30 AM  Result Value Ref Range   Sodium 139 135 - 145 mmol/L   Potassium 3.6 3.5 - 5.1 mmol/L   Chloride 106 96 - 112 mmol/L   CO2 25 19 - 32 mmol/L   Glucose, Bld 122 (H) 70 - 99 mg/dL   BUN 7 6 - 23 mg/dL   Creatinine, Ser 0.92 0.50 - 1.35 mg/dL   Calcium 8.8 8.4 - 10.5 mg/dL   Total Protein 7.5 6.0 - 8.3 g/dL   Albumin 3.6 3.5 - 5.2 g/dL   AST 1338 (H) 0 - 37 U/L   ALT 773 (H) 0 - 53 U/L   Alkaline Phosphatase 58 39 - 117 U/L   Total  Bilirubin 1.6 (H) 0.3 - 1.2 mg/dL   GFR calc non Af Amer 89 (L) >90 mL/min   GFR calc Af Amer >90 >90 mL/min    Comment: (NOTE) The eGFR has been calculated using the CKD EPI equation. This calculation has not been validated in all clinical situations. eGFR's persistently <90 mL/min signify possible Chronic Kidney Disease.    Anion gap 8 5 - 15  CBC     Status: Abnormal   Collection Time: 08/27/14  3:30 AM  Result Value Ref Range  WBC 5.2 4.0 - 10.5 K/uL   RBC 4.18 (L) 4.22 - 5.81 MIL/uL   Hemoglobin 14.0 13.0 - 17.0 g/dL   HCT 41.0 39.0 - 52.0 %   MCV 98.1 78.0 - 100.0 fL   MCH 33.5 26.0 - 34.0 pg   MCHC 34.1 30.0 - 36.0 g/dL   RDW 13.1 11.5 - 15.5 %   Platelets 110 (L) 150 - 400 K/uL    Comment: CONSISTENT WITH PREVIOUS RESULT  APTT     Status: Abnormal   Collection Time: 08/27/14  3:30 AM  Result Value Ref Range   aPTT 59 (H) 24 - 37 seconds    Comment:        IF BASELINE aPTT IS ELEVATED, SUGGEST PATIENT RISK ASSESSMENT BE USED TO DETERMINE APPROPRIATE ANTICOAGULANT THERAPY.   Protime-INR     Status: Abnormal   Collection Time: 08/27/14  3:30 AM  Result Value Ref Range   Prothrombin Time 18.6 (H) 11.6 - 15.2 seconds   INR 1.54 (H) 0.00 - 1.49  Brain natriuretic peptide     Status: Abnormal   Collection Time: 08/27/14  3:30 AM  Result Value Ref Range   B Natriuretic Peptide 262.4 (H) 0.0 - 100.0 pg/mL  APTT     Status: Abnormal   Collection Time: 08/28/14  3:33 AM  Result Value Ref Range   aPTT 58 (H) 24 - 37 seconds    Comment:        IF BASELINE aPTT IS ELEVATED, SUGGEST PATIENT RISK ASSESSMENT BE USED TO DETERMINE APPROPRIATE ANTICOAGULANT THERAPY.   Comprehensive metabolic panel     Status: Abnormal   Collection Time: 08/28/14  3:33 AM  Result Value Ref Range   Sodium 138 135 - 145 mmol/L   Potassium 3.9 3.5 - 5.1 mmol/L   Chloride 103 96 - 112 mmol/L   CO2 24 19 - 32 mmol/L   Glucose, Bld 134 (H) 70 - 99 mg/dL   BUN 11 6 - 23 mg/dL   Creatinine,  Ser 0.82 0.50 - 1.35 mg/dL   Calcium 9.2 8.4 - 10.5 mg/dL   Total Protein 7.8 6.0 - 8.3 g/dL   Albumin 3.6 3.5 - 5.2 g/dL   AST 444 (H) 0 - 37 U/L   ALT 551 (H) 0 - 53 U/L   Alkaline Phosphatase 58 39 - 117 U/L   Total Bilirubin 1.3 (H) 0.3 - 1.2 mg/dL   GFR calc non Af Amer >90 >90 mL/min   GFR calc Af Amer >90 >90 mL/min    Comment: (NOTE) The eGFR has been calculated using the CKD EPI equation. This calculation has not been validated in all clinical situations. eGFR's persistently <90 mL/min signify possible Chronic Kidney Disease.    Anion gap 11 5 - 15  Protime-INR     Status: Abnormal   Collection Time: 08/28/14  3:33 AM  Result Value Ref Range   Prothrombin Time 18.0 (H) 11.6 - 15.2 seconds   INR 1.48 0.00 - 1.49  CBC     Status: Abnormal   Collection Time: 08/28/14  3:33 AM  Result Value Ref Range   WBC 4.8 4.0 - 10.5 K/uL   RBC 4.29 4.22 - 5.81 MIL/uL   Hemoglobin 14.5 13.0 - 17.0 g/dL   HCT 41.1 39.0 - 52.0 %   MCV 95.8 78.0 - 100.0 fL   MCH 33.8 26.0 - 34.0 pg   MCHC 35.3 30.0 - 36.0 g/dL   RDW 12.6 11.5 - 15.5 %  Platelets 134 (L) 150 - 400 K/uL    Vitals: Blood pressure 177/109, pulse 75, temperature 97.9 F (36.6 C), temperature source Oral, resp. rate 22, height 5' 8"  (1.727 m), weight 87.4 kg (192 lb 10.9 oz), SpO2 99 %.  Risk to Self: Is patient at risk for suicide?: No Risk to Others:   Prior Inpatient Therapy:   Prior Outpatient Therapy:    Current Facility-Administered Medications  Medication Dose Route Frequency Provider Last Rate Last Dose  . acetaminophen (TYLENOL) tablet 650 mg  650 mg Oral Q6H PRN Venetia Maxon Rama, MD       Or  . acetaminophen (TYLENOL) suppository 650 mg  650 mg Rectal Q6H PRN Venetia Maxon Rama, MD      . alum & mag hydroxide-simeth (MAALOX/MYLANTA) 200-200-20 MG/5ML suspension 30 mL  30 mL Oral Q6H PRN Christina P Rama, MD      . atorvastatin (LIPITOR) tablet 10 mg  10 mg Oral q1800 Sahar M Osman, PA-C   10 mg at 08/27/14  1800  . bivalirudin (ANGIOMAX) 250 mg in sodium chloride 0.9 % 500 mL (0.5 mg/mL) infusion  0.15 mg/kg/hr Intravenous Continuous Venetia Maxon Rama, MD 25.7 mL/hr at 08/27/14 2332 0.15 mg/kg/hr at 08/27/14 2332  . cloNIDine (CATAPRES) tablet 0.2 mg  0.2 mg Oral BID Christina P Rama, MD      . diltiazem (CARDIZEM CD) 24 hr capsule 240 mg  240 mg Oral Daily Venetia Maxon Rama, MD   240 mg at 08/28/14 0914  . feeding supplement (ENSURE ENLIVE) (ENSURE ENLIVE) liquid 237 mL  237 mL Oral PRN Dorann Ou, RD      . folic acid (FOLVITE) tablet 1 mg  1 mg Oral Daily Venetia Maxon Rama, MD   1 mg at 08/28/14 0914  . hydrochlorothiazide (HYDRODIURIL) tablet 25 mg  25 mg Oral Daily Venetia Maxon Rama, MD   25 mg at 08/28/14 0920  . LORazepam (ATIVAN) injection 0-4 mg  0-4 mg Intravenous Q12H Venetia Maxon Rama, MD   0 mg at 08/27/14 1851  . LORazepam (ATIVAN) tablet 1 mg  1 mg Oral Q6H PRN Venetia Maxon Rama, MD       Or  . LORazepam (ATIVAN) injection 1 mg  1 mg Intravenous Q6H PRN Christina P Rama, MD      . metoprolol (LOPRESSOR) injection 5 mg  5 mg Intravenous Q4H PRN Waldon Merl, PA-C   5 mg at 08/28/14 0529  . metoprolol tartrate (LOPRESSOR) tablet 25 mg  25 mg Oral Q6H Belva Crome, MD   25 mg at 08/28/14 0914  . multivitamin with minerals tablet 1 tablet  1 tablet Oral Daily Venetia Maxon Rama, MD   1 tablet at 08/28/14 0914  . ondansetron (ZOFRAN) tablet 4 mg  4 mg Oral Q6H PRN Venetia Maxon Rama, MD       Or  . ondansetron (ZOFRAN) injection 4 mg  4 mg Intravenous Q6H PRN Venetia Maxon Rama, MD   4 mg at 08/25/14 1908  . pantoprazole (PROTONIX) EC tablet 40 mg  40 mg Oral Q0600 Venetia Maxon Rama, MD   40 mg at 08/28/14 0512  . prednisoLONE tablet 40 mg  40 mg Oral Daily Venetia Maxon Rama, MD   40 mg at 08/27/14 0958  . thiamine (VITAMIN B-1) tablet 100 mg  100 mg Oral Daily Venetia Maxon Rama, MD   100 mg at 08/28/14 0914   Or  . thiamine (B-1) injection 100 mg  100 mg  Intravenous Daily Venetia Maxon Rama, MD    100 mg at 08/25/14 2030    Musculoskeletal: Strength & Muscle Tone: decreased Gait & Station: unable to stand Patient leans: N/A  Psychiatric Specialty Exam: Physical Exam as per history and physical   ROS depression, anxiety decreased psychomotor activity and recent status post alcohol withdrawal symptoms   Blood pressure 177/109, pulse 75, temperature 97.9 F (36.6 C), temperature source Oral, resp. rate 22, height 5' 8"  (1.727 m), weight 87.4 kg (192 lb 10.9 oz), SpO2 99 %.Body mass index is 29.3 kg/(m^2).  General Appearance: Guarded  Eye Contact::  Good  Speech:  Clear and Coherent  Volume:  Decreased  Mood:  Anxious and Depressed  Affect:  Appropriate and Congruent  Thought Process:  Coherent and Goal Directed  Orientation:  Full (Time, Place, and Person)  Thought Content:  WDL  Suicidal Thoughts:  No  Homicidal Thoughts:  No  Memory:  Immediate;   Good Recent;   Good  Judgement:  Fair  Insight:  Lacking  Psychomotor Activity:  Decreased  Concentration:  Good  Recall:  Good  Fund of Knowledge:Good  Language: Good  Akathisia:  Negative  Handed:  Right  AIMS (if indicated):     Assets:  Communication Skills Desire for Improvement Financial Resources/Insurance Housing Intimacy Leisure Time Resilience Social Support Talents/Skills Transportation  ADL's:  Impaired  Cognition: Impaired,  Mild  Sleep:      Medical Decision Making: New problem, with additional work up planned, Review of Psycho-Social Stressors (1), Review or order clinical lab tests (1), Established Problem, Worsening (2), New Problem, with no additional work-up planned (3), Review or order medicine tests (1), Review of Medication Regimen & Side Effects (2) and Review of New Medication or Change in Dosage (2)  Treatment Plan Summary: Daily contact with patient to assess and evaluate symptoms and progress in treatment and Medication management  Plan:  Will start fluoxetine 20 mg daily for  depression and Remeron 7.5 mg at bedtime for insomnia Monitor for the adverse effect of the medication. Patient does not meet criteria for psychiatric inpatient admission. Supportive therapy provided about ongoing stressors. Appreciate psychiatric consultation and follow up as clinically required Please contact [phone] or [phone] if needs further assistance  Dispositipatient will be referred to the outpatient psychiatric services and medically stable and psychiatric consultation and psychiatric social service follow-up as needed.  Cataleia Gade,JANARDHAHA R. 08/28/2014 9:36 AM

## 2014-08-28 NOTE — Progress Notes (Signed)
Progress Note   Benjamin Blackwell ZOX:[id number] DOB: 02/18/1951 DOA: 08/25/2014 PCP: No primary care provider on file.   Brief Narrative:   Benjamin Blackwell is an 64 y.o. male a PMH of alcohol dependency who was admitted 08/25/14 after suffering from an alcohol withdrawal seizure. On initial presentation, the patient was found to be in atrial fibrillation with RVR.  Assessment/Plan:   Principal Problem:  Alcohol withdrawal seizure / Alcohol dependency - Continue seizure precautions. - Continue to detox with Ativan per CIWA protocol. - Supplementing thiamine, folic acid. - Stable, transfer to tele bed.  Active Problems:   Metabolic acidosis - Likely from elevated lactate levels from seizure activity. Resolved.   Atrial fibrillation/flutter with RVR / demand ischemia - TSH WNL.   - Mild elevation of troponins noted along with ST elevations in the inferior leads on repeat 12-lead EKG, likely demand ischemia.  - 2-D echo shows normal LA size, mild LVH with a normal EF. Slight elevation of BNP noted. - Heart rate 60s-80s on Cardizem drip for rate control.  Transition to oral Cardizem and add PO metoprolol. - Continue aspirin, Cardizem, metoprolol and bivalirudin. - Cardiology following. - Will start coumadin. - On Lipitor per cardiology, OK to continue for now since LFTs improved.   Hypertension  - Continue Cardizem, clonidine (dose increased) and metoprolol (dose increased).  Add HCTZ.   Hypokalemia  - Repleted. Magnesium okay.    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   - Discriminant function is 32.  Continue steroids and PPI therapy. - LFTs and ProTime improving.    Hyperglycemia/prediabetes - Hemoglobin A1c 5.8%. [place] [place]- We'll get dietitian consult for dietary counseling.   Thrombocytopenia - Likely from toxic effects of ETOH on bone marrow. - Stable, improving.   DVT prophylaxis - Anticoagulated with  bivalirudin.  Code Status: Full. Family Communication: Daughter Benjamin Blackwell updated at bedside 08/26/14. Disposition Plan: Home when stable.   IV Access:    Peripheral IV   Procedures and diagnostic studies:   Ct Head Wo Contrast 08/25/2014: 1. 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 No significant mass effect to suggest acute or subacute process. 2.  Cerebral atrophy and small vessel ischemic change.   Dg Chest Port 1 View 08/25/2014: Moderate cardiomegaly with central vascular congestion but no focal acute finding.     2-D echo 08/27/14: Study Conclusions  - Left ventricle: The cavity size was normal. There was mild concentric hypertrophy. Systolic function was normal. The estimated ejection fraction was in the range of 50% to 55%. Wall motion was normal; there were no regional wall motion abnormalities. - Aortic valve: Trileaflet; mildly thickened, mildly calcified leaflets. - Mitral valve: There was trivial regurgitation  Medical Consultants:    Kathleene Hazelhristopher D McAlhany, MD, Cardiology  Anti-Infectives:    None.  Subjective:   Benjamin Blackwell denies chest pain or shortness of breath.  No nausea or vomiting.  Appetite good.  He tells me he is willing to give up alcohol.   Objective:    Filed Vitals:   08/28/14 0300 08/28/14 0400 08/28/14 0500 08/28/14 0600  BP: 155/117 163/108 169/111 165/112  Pulse: 72 69 70 66  Temp:  98.2 F (36.8 C)    TempSrc:  Oral    Resp: 27 28 23 20   Height:      Weight:  87.4 kg (192 lb 10.9 oz)    SpO2: 100% 97% 97% 98%    Intake/Output  Summary (Last 24 hours) at 08/28/14 4540 Last data filed at 08/28/14 0300  Gross per 24 hour  Intake  551.4 ml  Output   2150 ml  Net -1598.6 ml    Exam: Gen:  NAD Cardiovascular:  HSIR Respiratory:  Lungs CTAB Gastrointestinal:  Abdomen soft, NT/ND, + BS Extremities:  No C/E/C   Data Reviewed:    Labs: Basic Metabolic Panel:  Recent  Labs Lab 08/25/14 1510 08/26/14 0346 08/27/14 0330 08/28/14 0333  NA 143 143 139 138  K 4.1 3.2* 3.6 3.9  CL 105 106 106 103  CO2 13* GLUCOSE 190* 121* 122* 134*  BUN CREATININE 1.21 0.98 0.92 0.82  CALCIUM 9.3 9.1 8.8 9.2  MG  --  2.0  --   --    GFR Estimated Creatinine Clearance: 100.4 mL/min (by C-G formula based on Cr of 0.82). Liver Function Tests:  Recent Labs Lab 08/25/14 1853 08/27/14 0330 08/28/14 0333  AST 280* 1338* 444*  ALT 191* 773* 551*  ALKPHOS 70 58 58  BILITOT 1.7* 1.6* 1.3*  PROT 9.0* 7.5 7.8  ALBUMIN 4.5 3.6 3.6   Coagulation profile  Recent Labs Lab 08/27/14 0330 08/28/14 0333  INR 1.54* 1.48    CBC:  Recent Labs Lab 08/25/14 1510 08/26/14 0346 08/27/14 0330 08/28/14 0333  WBC 3.5* 6.0 5.2 4.8  HGB 14.4 14.5 14.0 14.5  HCT 42.9 41.1 41.0 41.1  MCV 99.5 96.3 98.1 95.8  PLT 108* 103* 110* 134*   Cardiac Enzymes:  Recent Labs Lab 08/25/14 1853 08/26/14 0100 08/26/14 0800  TROPONINI 0.07* 0.12* 0.10*   CBG:  Recent Labs Lab 08/25/14 1445  GLUCAP 178*   Hgb A1c  Recent Labs  08/26/14 0100  HGBA1C 5.8*   Thyroid function studies  Recent Labs  08/26/14 0100  TSH 0.746   Microbiology Recent Results (from the past 240 hour(s))  MRSA PCR Screening     Status: None   Collection Time: 08/25/14  6:40 PM  Result Value Ref Range Status   MRSA by PCR NEGATIVE NEGATIVE Final    Comment:        The GeneXpert MRSA Assay (FDA approved for NASAL specimens only), is one component of a comprehensive MRSA colonization surveillance program. It is not intended to diagnose MRSA infection nor to guide or monitor treatment for MRSA infections.      Medications:   . aspirin EC  81 mg Oral Daily  . atorvastatin  10 mg Oral q1800  . cloNIDine  0.1 mg Oral BID  . diltiazem  240 mg Oral Daily  . folic acid  1 mg Oral Daily  . LORazepam  0-4 mg Intravenous Q12H  . metoprolol tartrate  25 mg Oral  Q6H  . multivitamin with minerals  1 tablet Oral Daily  . pantoprazole  40 mg Oral Q0600  . prednisoLONE  40 mg Oral Daily  . thiamine  100 mg Oral Daily   Or  . thiamine  100 mg Intravenous Daily   Continuous Infusions: . bivalirudin (ANGIOMAX) infusion 0.5 mg/mL (Non-ACS indications) 0.15 mg/kg/hr (08/27/14 2332)    Time spent: 25 minutes.    LOS: 3 days   Amadeo Coke  [facility] Pager [phone]. If unable to reach me by pager, please call my cell phone at [phone].  *Please refer to [email or web address], password [email or web address] to get updated schedule on who will round on this patient, as hospitalists switch teams weekly. If 7PM-7AM, please  contact night-coverage at [email or web address], password [email or web address] for any overnight needs.  08/28/2014, 7:22 AM

## 2014-08-28 NOTE — Progress Notes (Signed)
CSW continuing to follow.  CSW re-visited with pt this morning to offer substance abuse resources. Pt agreeable to accepting resources today.  [place] lives in [place], [place] and CSW provided information for [facility] in [place], [place]. CSW encouraged [facility] as they likely have different treatment options and it will be important for pt to contact [facility] to display his motivation for change. CSW discussed with pt that there are likely Alcoholics Anonymous meetings in the area, but [facility] should be able to provide additional information about other resources in that area. Pt states that he is familiar with an Alcoholics Anonymous meeting and agree[facility] to [facility]. Address and phone number for [facility] provided on hand out with additional national hotlines that may be of support.   No further social work needs identified at this time.  CSW signing off.   Please re-consult if further social work needs arise.   Loletta SpecterSuzanna Noam Blackwell, MSW, LCSW Clinical Social Work [phone]

## 2014-08-28 NOTE — Progress Notes (Signed)
Clinical Social Work Department CLINICAL SOCIAL WORK PSYCHIATRY SERVICE LINE ASSESSMENT 08/28/2014  Patient:  Benjamin Blackwell  Account:  [id number]  Admit Date:  08/25/2014  Clinical Social Worker:  Peri Maris, CLINICAL SOCIAL WORKER  Date/Time:  08/28/2014 02:30 PM Referred by:  Physician  Date referred:  08/28/2014 Reason for Referral  Behavioral Health Issues  Substance Abuse   Presenting Symptoms/Problems (In the person's/family's own words):   Pt referred by physician for substance abuse and depression   Abuse/Neglect/Trauma History (check all that apply)  Denies history   Abuse/Neglect/Trauma Comments:   Psychiatric History (check all that apply)  Denies history   Psychiatric medications:  fluoxetine 20 mg  Remeron 7.5 mg   Current Mental Health Hospitalizations/Previous Mental Health History:   Pt reports that he has a long history of stubstance abuse and occassional feelings of sadness but is not able to delineate how long his periods of sadness last.   Current provider:   N/A   Place and Date:   N/A   Current Medications:   Scheduled Meds:      . aspirin  81 mg Oral Daily  . atorvastatin  10 mg Oral q1800  . cloNIDine  0.2 mg Oral BID  . diltiazem  240 mg Oral Daily  . FLUoxetine  20 mg Oral Daily  . folic acid  1 mg Oral Daily  . hydrochlorothiazide  25 mg Oral Daily  . LORazepam  0-4 mg Intravenous Q12H  . metoprolol tartrate  25 mg Oral Q6H  . mirtazapine  7.5 mg Oral QHS  . multivitamin with minerals  1 tablet Oral Daily  . pantoprazole  40 mg Oral Q0600  . prednisoLONE  40 mg Oral Daily  . thiamine  100 mg Oral Daily   Or     . thiamine  100 mg Intravenous Daily        Continuous Infusions:      PRN Meds:.acetaminophen **OR** acetaminophen, alum & mag hydroxide-simeth, feeding supplement (ENSURE ENLIVE), LORazepam **OR** LORazepam, metoprolol, ondansetron **OR** ondansetron (ZOFRAN) IV       Previous Impatient Admission/Date/Reason:   Pt denies    Emotional Health / Current Symptoms    Suicide/Self Harm  None reported   Suicide attempt in the past:   Pt denies   Other harmful behavior:   Pt denies   Psychotic/Dissociative Symptoms  None reported   Other Psychotic/Dissociative Symptoms:   N/A    Attention/Behavioral Symptoms  Within Normal Limits   Other Attention / Behavioral Symptoms:   N/A    Cognitive Impairment  Within Normal Limits   Other Cognitive Impairment:   N/A    Mood and Adjustment  Mood Congruent    Stress, Anxiety, Trauma, Any Recent Loss/Stressor  Grief/Loss (recent or history)   Anxiety (frequency):   Pt denies   Phobia (specify):   Pt denies   Compulsive behavior (specify):   Pt denies   Obsessive behavior (specify):   Pt denies   Other:   Pt reports that he lost his parents at a young age, approximately at the same time he began abusing alcohol. Pt became tearful when he was telling Probation officer about loosing his parents.   Substance Abuse/Use  History of substance use  Current substance use   SBIRT completed (please refer for detailed history):  N  Self-reported substance use:   Pt reports that he drinks approximately half a case of beer daily and has been abusing alcohol since age 67.   Urinary Drug Screen  Completed:  Y Alcohol level:   .034 on 4/3    Environmental/Housing/Living Arrangement  Stable housing   Who is in the home:   Pt and long-time girlfriend   Emergency contact:  Silva Bandy, sister   Financial  IPRS   Patient's Strengths and Goals (patient's own words):   Pt did not discuss his strengths but he reports having a supportive family. He also has had a 6 month stint of sobriety but did not state when that was.   Clinical Social Worker's Interpretive Summary:   CSW met with Pt along with psych MD. Pt was pleasant and cooperative throughout assessment. He was forthcoming with information regarding substance abuse and reports a history of seizures  related to withdrawal. He reported being somewhat motivated to stop drinking but was apprehensive regarding what kind of treatment he was interested in. Pt described his desire to quit on his own, "cold [place]." Pt became tearful when discussing the loss of his parents as an adolescent. Pt reports living with a long-time girlfriend and having a good relationship with his daughter who is 28.    Pt was hesitant to give consent for CSW to contact family. CSW will meet with Pt again to assess further for level of motivation for treatment and to discuss obtaining collateral information.   Disposition:  Recommend Psych CSW continuing to support while in hospital   Peri Maris, [place] 08/28/2014 4:28 PM [phone]

## 2014-08-28 NOTE — Plan of Care (Signed)
Problem: Food- and Nutrition-Related Knowledge Deficit (NB-1.1) Goal: Nutrition education Formal process to instruct or train a patient/client in a skill or to impart knowledge to help patients/clients voluntarily manage or modify food choices and eating behavior to maintain or improve health. Outcome: Completed/Met Date Met:  08/28/14  RD consulted for nutrition education regarding diabetes.     Lab Results  Component Value Date    HGBA1C 5.8* 08/26/2014    RD provided "Carbohydrate Counting for People with Diabetes" handout from the Academy of Nutrition and Dietetics. Discussed different food groups and their effects on blood sugar, emphasizing carbohydrate-containing foods. Provided list of carbohydrates and recommended serving sizes of common foods.  Discussed importance of controlled and consistent carbohydrate intake throughout the day. Provided examples of ways to balance meals/snacks and encouraged intake of high-fiber, whole grain complex carbohydrates. Teach back method used.  Expect good compliance.  Body mass index is 29.3 kg/(m^2). Pt meets criteria for overweight based on current BMI.  Current diet order is regular, patient is consuming approximately 50% of meals at this time. Labs and medications reviewed. No further nutrition interventions warranted at this time. RD contact information provided. If additional nutrition issues arise, please re-consult RD.  Laurette Schimke [place], [place], [place]

## 2014-08-28 NOTE — Progress Notes (Signed)
       Patient Name: Benjamin Blackwell Date of Encounter: 08/28/2014    SUBJECTIVE: Asleep.  TELEMETRY:  A fib/flutter with rate control Filed Vitals:   08/28/14 0300 08/28/14 0400 08/28/14 0500 08/28/14 0600  BP: 155/117 163/108 169/111 165/112  Pulse: 72 69 70 66  Temp:  98.2 F (36.8 C)    TempSrc:  Oral    Resp: 27 28 23 20   Height:      Weight:  192 lb 10.9 oz (87.4 kg)    SpO2: 100% 97% 97% 98%    Intake/Output Summary (Last 24 hours) at 08/28/14 0708 Last data filed at 08/28/14 0300  Gross per 24 hour  Intake  551.4 ml  Output   2150 ml  Net -1598.6 ml   LABS: Basic Metabolic Panel:  Recent Labs  16/02/9603/04/16 0346 08/27/14 0330 08/28/14 0333  NA 143 139 138  K 3.2* 3.6 3.9  CL 106 106 103  CO2 24 25 24   GLUCOSE 121* 122* 134*  BUN 7 7 11   CREATININE 0.98 0.92 0.82  CALCIUM 9.1 8.8 9.2  MG 2.0  --   --    CBC:  Recent Labs  08/27/14 0330 08/28/14 0333  WBC 5.2 4.8  HGB 14.0 14.5  HCT 41.0 41.1  MCV 98.1 95.8  PLT 110* 134*   Cardiac Enzymes:  Recent Labs  08/25/14 1853 08/26/14 0100 08/26/14 0800  TROPONINI 0.07* 0.12* 0.10*   BNP    Component Value Date/Time   BNP 262.4* 08/27/2014 0330    ProBNP No results found for: PROBNP Hemoglobin A1C:  Recent Labs  08/26/14 0100  HGBA1C 5.8*   ECHOCARDIOGRAPHY: 08/27/14 Study Conclusions  - Left ventricle: The cavity size was normal. There was mild concentric hypertrophy. Systolic function was normal. The estimated ejection fraction was in the range of 50% to 55%. Wall motion was normal; there were no regional wall motion abnormalities. - Aortic valve: Trileaflet; mildly thickened, mildly calcified leaflets. - Mitral valve: There was trivial regurgitation.  Physical Exam: Blood pressure 165/112, pulse 66, temperature 98.2 F (36.8 C), temperature source Oral, resp. rate 20, height 5\' 8"  (1.727 m), weight 192 lb 10.9 oz (87.4 kg), SpO2 98 %. Weight change: -3 lb 1.4 oz  (-1.4 kg)  Wt Readings from Last 3 Encounters:  08/28/14 192 lb 10.9 oz (87.4 kg)    Asleep Chest clear without wheezing No edema  ASSESSMENT:  1. Atrial fibrillation with rate control 2. Echo with normal LA size and mild LVH with EF preserved. BNP is essentially benign.  Plan:  1. Beta blocker therapy. May not need both diltiazem and betablocker once withdrawal controlled. 2. Aspirin 3. Anticoagulation still on table but may not be a good idea if ETOH will continue.  Windy FastSigned, Taksh Hjort III,Emili Mcloughlin W 08/28/2014, 7:08 AM

## 2014-08-29 DIAGNOSIS — R569 Unspecified convulsions: Secondary | ICD-10-CM | POA: Insufficient documentation

## 2014-08-29 LAB — COMPREHENSIVE METABOLIC PANEL
ALBUMIN: 3.7 g/dL (ref 3.5–5.2)
ALK PHOS: 62 U/L (ref 39–117)
ALT: 381 U/L — ABNORMAL HIGH (ref 0–53)
AST: 203 U/L — AB (ref 0–37)
Anion gap: 11 (ref 5–15)
BUN: 19 mg/dL (ref 6–23)
CHLORIDE: 102 mmol/L (ref 96–112)
CO2: 25 mmol/L (ref 19–32)
Calcium: 9.3 mg/dL (ref 8.4–10.5)
Creatinine, Ser: 1.08 mg/dL (ref 0.50–1.35)
GFR calc Af Amer: 83 mL/min — ABNORMAL LOW (ref >90)
GFR calc non Af Amer: 72 mL/min — ABNORMAL LOW (ref >90)
GLUCOSE: 148 mg/dL — AB (ref 70–99)
POTASSIUM: 3.6 mmol/L (ref 3.5–5.1)
SODIUM: 138 mmol/L (ref 135–145)
Total Bilirubin: 0.8 mg/dL (ref 0.3–1.2)
Total Protein: 7.8 g/dL (ref 6.0–8.3)

## 2014-08-29 LAB — APTT
APTT: 48 s — AB (ref 24–37)
aPTT: 26 seconds (ref 24–37)
aPTT: 47 seconds — ABNORMAL HIGH (ref 24–37)

## 2014-08-29 LAB — CBC
HCT: 42.2 % (ref 39.0–52.0)
Hemoglobin: 14.8 g/dL (ref 13.0–17.0)
MCH: 33.5 pg (ref 26.0–34.0)
MCHC: 35.1 g/dL (ref 30.0–36.0)
MCV: 95.5 fL (ref 78.0–100.0)
Platelets: 172 10*3/uL (ref 150–400)
RBC: 4.42 MIL/uL (ref 4.22–5.81)
RDW: 12.4 % (ref 11.5–15.5)
WBC: 6 10*3/uL (ref 4.0–10.5)

## 2014-08-29 LAB — PROTIME-INR
INR: 1.1 (ref 0.00–1.49)
Prothrombin Time: 14.3 seconds (ref 11.6–15.2)

## 2014-08-29 MED ORDER — SODIUM CHLORIDE 0.9 % IV SOLN
0.1700 mg/kg/h | INTRAVENOUS | Status: DC
  Administered 2014-08-29: 0.17 mg/kg/h via INTRAVENOUS
  Filled 2014-08-29: qty 250

## 2014-08-29 MED ORDER — METOPROLOL TARTRATE 50 MG PO TABS
50.0000 mg | ORAL_TABLET | Freq: Two times a day (BID) | ORAL | Status: DC
  Administered 2014-08-29: 50 mg via ORAL
  Filled 2014-08-29: qty 1

## 2014-08-29 MED ORDER — BIVALIRUDIN 250 MG IV SOLR
0.1500 mg/kg/h | INTRAVENOUS | Status: DC
  Administered 2014-08-29: 0.15 mg/kg/h via INTRAVENOUS
  Filled 2014-08-29: qty 250

## 2014-08-29 MED ORDER — WARFARIN - PHARMACIST DOSING INPATIENT
Freq: Every day | Status: DC
  Administered 2014-08-30: 18:00:00

## 2014-08-29 MED ORDER — WARFARIN SODIUM 5 MG PO TABS
5.0000 mg | ORAL_TABLET | Freq: Once | ORAL | Status: AC
  Administered 2014-08-29: 5 mg via ORAL
  Filled 2014-08-29: qty 1

## 2014-08-29 MED ORDER — SODIUM CHLORIDE 0.9 % IV SOLN
0.2000 mg/kg/h | INTRAVENOUS | Status: DC
  Administered 2014-08-29 – 2014-08-30 (×3): 0.2 mg/kg/h via INTRAVENOUS
  Filled 2014-08-29 (×2): qty 250

## 2014-08-29 NOTE — Consult Note (Signed)
Psychiatry Consult follow-up  Reason for Consult:  Alcohol withdrawal with seizure, alcoholic hepatitis Referring Physician:  Dr. Rockne Menghini Patient Identification: Benjamin Blackwell MRN:  [id number] Principal Diagnosis: Alcohol withdrawal seizure Diagnosis:   Patient Active Problem List   Diagnosis Date Noted  . Prediabetes [R73.09] 08/28/2014  . SOB (shortness of breath) [R06.02]   . Alcoholic hepatitis [T26.71] 08/27/2014  . Hypokalemia [E87.6] 08/26/2014  . Elevated LFTs [R79.89] 08/26/2014  . Metabolic acidosis [I45.8] 08/26/2014  . Atrial fibrillation with RVR [I48.91] 08/25/2014  . Alcohol withdrawal seizure [F10.239] 08/25/2014  . Alcohol dependency [F10.20] 08/25/2014  . Hypertension [I10] 08/25/2014  . Hyperglycemia [R73.9] 08/25/2014  . Thrombocytopenia [D69.6] 08/25/2014    Total Time spent with patient: 30 minutes  Subjective:   Benjamin Blackwell is a 64 y.o. male patient admitted with status post alcohol withdrawal seizures.  HPI:  Benjamin Blackwell is an 64 y.o. male seen, chart reviewed and case discussed with the Dr. Rockne Menghini and psychiatric social service. Patient reported he has been depressed secondary to losing his mother and father when he was young. Patient reportedly dropped out of school and started working as a teenager. Patient married and has a child who is 78 years old. Patient reportedly never received medication management for depression or counseling. Patient denies current symptoms of suicidal, homicidal ideation, intention or plans. Patient reportedly drinking mostly 3 beer before going to the work and 6 pack after work since he was 64 years old. Patient has a previous of sober one time about 7 days and other time about 6 months. Patient was involved with DWI twice and was forced 4 substance abuse classes in the past. 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 Patient reportedly visiting [place] to attend a nephew wedding and then filed down with the alcohol withdrawal seizure. Patient reported he has a past history of alcohol withdrawal seizures 2. Patient stated he promised his wife is going to quit for good now and willing to receive substance abuse rehabilitation treatment upon discharge from the hospital in his county. Patient reported his wife is supportive to him. Patient is willing to try antidepressant medication for depression and insomnia.   08/29/2014  Interval history: Patient seen today for psychiatric consultation follow-up. Patient reported he has been taking medication as prescribed, tolerated well and has no reported side effects. Patient reported he is feeling better without any significant emotional or behavioral problem today. Patient stated he slept better last night with medication. Patient denied any further seizure episodes. Patient has been in contact with his sister who is providing moral support to him. Psychiatric social service following with the patient regarding outpatient substance abuse and mental health resources. Patient has no safety concerns, and denied current suicidal/homicidal ideation, intention or plans. Patient has no evidence of psychotic symptoms.   Past Medical History:  Past Medical History  Diagnosis Date  . Hypertension   . ETOH abuse   . Seizures     ETOH related  . A-fib     Past Surgical History  Procedure Laterality Date  . Shoulder surgery      left shoulder   Family History:  Family History  Problem Relation Age of Onset  . Heart attack Mother   . Heart attack Father   . Heart attack Brother   . Heart attack Brother   . Diabetes Father   .  Diabetes Brother   . Diabetes Sister    Social History:  History  Alcohol Use  . 0.0 oz/week  . 0 Standard drinks or equivalent per week    Comment: Daily, 12 pack a day, sometimes more, occasional liquor     History   Drug Use  . Yes    Comment: Marijuana    History   Social History  . Marital Status: Divorced    Spouse Name: N/A  . Number of Children: 1  . Years of Education: N/A   Occupational History  . Laborer    Social History Main Topics  . Smoking status: Never Smoker   . Smokeless tobacco: Not on file  . Alcohol Use: 0.0 oz/week    0 Standard drinks or equivalent per week     Comment: Daily, 12 pack a day, sometimes more, occasional liquor  . Drug Use: Yes     Comment: Marijuana  . Sexual Activity: Not on file   Other Topics Concern  . None   Social History Narrative   Lives with his common law girlfriend.  Employed full time as a Arts administrator.     Additional Social History:                          Allergies:   Allergies  Allergen Reactions  . Beef-Derived Products Hives  . Pork-Derived [employer]:  Results for orders placed or performed during the hospital encounter of 08/25/14 (from the past 48 hour(s))  APTT     Status: Abnormal   Collection Time: 08/28/14  3:33 AM  Result Value Ref Range   aPTT 58 (H) 24 - 37 seconds    Comment:        IF BASELINE aPTT IS ELEVATED, SUGGEST PATIENT RISK ASSESSMENT BE USED TO DETERMINE APPROPRIATE ANTICOAGULANT THERAPY.   Comprehensive metabolic panel     Status: Abnormal   Collection Time: 08/28/14  3:33 AM  Result Value Ref Range   Sodium 138 135 - 145 mmol/L   Potassium 3.9 3.5 - 5.1 mmol/L   Chloride 103 96 - 112 mmol/L   CO2 24 19 - 32 mmol/L   Glucose, Bld 134 (H) 70 - 99 mg/dL   BUN 11 6 - 23 mg/dL   Creatinine, Ser 0.82 0.50 - 1.35 mg/dL   Calcium 9.2 8.4 - 10.5 mg/dL   Total Protein 7.8 6.0 - 8.3 g/dL   Albumin 3.6 3.5 - 5.2 g/dL   AST 444 (H) 0 - 37 U/L   ALT 551 (H) 0 - 53 U/L   Alkaline Phosphatase 58 39 - 117 U/L   Total Bilirubin 1.3 (H) 0.3 - 1.2 mg/dL   GFR calc non Af Amer >90 >90 mL/min   GFR calc Af Amer >90 >90 mL/min    Comment: (NOTE) The eGFR has been calculated using the  CKD EPI equation. This calculation has not been validated in all clinical situations. eGFR's persistently <90 mL/min signify possible Chronic Kidney Disease.    Anion gap 11 5 - 15  Protime-INR     Status: Abnormal   Collection Time: 08/28/14  3:33 AM  Result Value Ref Range   Prothrombin Time 18.0 (H) 11.6 - 15.2 seconds   INR 1.48 0.00 - 1.49  CBC     Status: Abnormal   Collection Time: 08/28/14  3:33 AM  Result Value Ref Range   WBC 4.8 4.0 - 10.5 K/uL  RBC 4.29 4.22 - 5.81 MIL/uL   Hemoglobin 14.5 13.0 - 17.0 g/dL   HCT 41.1 39.0 - 52.0 %   MCV 95.8 78.0 - 100.0 fL   MCH 33.8 26.0 - 34.0 pg   MCHC 35.3 30.0 - 36.0 g/dL   RDW 12.6 11.5 - 15.5 %   Platelets 134 (L) 150 - 400 K/uL  APTT     Status: None   Collection Time: 08/29/14  4:46 AM  Result Value Ref Range   aPTT 26 24 - 37 seconds  CBC     Status: None   Collection Time: 08/29/14  4:46 AM  Result Value Ref Range   WBC 6.0 4.0 - 10.5 K/uL   RBC 4.42 4.22 - 5.81 MIL/uL   Hemoglobin 14.8 13.0 - 17.0 g/dL   HCT 42.2 39.0 - 52.0 %   MCV 95.5 78.0 - 100.0 fL   MCH 33.5 26.0 - 34.0 pg   MCHC 35.1 30.0 - 36.0 g/dL   RDW 12.4 11.5 - 15.5 %   Platelets 172 150 - 400 K/uL  Comprehensive metabolic panel     Status: Abnormal   Collection Time: 08/29/14  4:46 AM  Result Value Ref Range   Sodium 138 135 - 145 mmol/L   Potassium 3.6 3.5 - 5.1 mmol/L   Chloride 102 96 - 112 mmol/L   CO2 25 19 - 32 mmol/L   Glucose, Bld 148 (H) 70 - 99 mg/dL   BUN 19 6 - 23 mg/dL   Creatinine, Ser 1.08 0.50 - 1.35 mg/dL   Calcium 9.3 8.4 - 10.5 mg/dL   Total Protein 7.8 6.0 - 8.3 g/dL   Albumin 3.7 3.5 - 5.2 g/dL   AST 203 (H) 0 - 37 U/L   ALT 381 (H) 0 - 53 U/L   Alkaline Phosphatase 62 39 - 117 U/L   Total Bilirubin 0.8 0.3 - 1.2 mg/dL   GFR calc non Af Amer 72 (L) >90 mL/min   GFR calc Af Amer 83 (L) >90 mL/min    Comment: (NOTE) The eGFR has been calculated using the CKD EPI equation. This calculation has not been validated in  all clinical situations. eGFR's persistently <90 mL/min signify possible Chronic Kidney Disease.    Anion gap 11 5 - 15  Protime-INR     Status: None   Collection Time: 08/29/14  4:46 AM  Result Value Ref Range   Prothrombin Time 14.3 11.6 - 15.2 seconds   INR 1.10 0.00 - 1.49    Vitals: Blood pressure 109/68, pulse 52, temperature 98.2 F (36.8 C), temperature source Oral, resp. rate 18, height _0  (1.727 m), weight 87.4 kg (192 lb 10.9 oz), SpO2 99 %.  Risk to Self: Is patient at risk for suicide?: No Risk to Others:   Prior Inpatient Therapy:   Prior Outpatient Therapy:    Current Facility-Administered Medications  Medication Dose Route Frequency Provider Last Rate Last Dose  . acetaminophen (TYLENOL) tablet 650 mg  650 mg Oral Q6H PRN Venetia Maxon Rama, MD       Or  . acetaminophen (TYLENOL) suppository 650 mg  650 mg Rectal Q6H PRN Venetia Maxon Rama, MD      . alum & mag hydroxide-simeth (MAALOX/MYLANTA) 200-200-20 MG/5ML suspension 30 mL  30 mL Oral Q6H PRN Christina P Rama, MD      . atorvastatin (LIPITOR) tablet 10 mg  10 mg Oral q1800 Sahar M Osman, PA-C   10 mg at 08/28/14 1623  .  bivalirudin (ANGIOMAX) 250 mg in sodium chloride 0.9 % 500 mL (0.5 mg/mL) infusion  0.15 mg/kg/hr Intravenous Continuous Berton Mount, RPH 25.7 mL/hr at 08/29/14 1124 0.15 mg/kg/hr at 08/29/14 1124  . cloNIDine (CATAPRES) tablet 0.2 mg  0.2 mg Oral BID Venetia Maxon Rama, MD   0.2 mg at 08/29/14 0832  . feeding supplement (ENSURE ENLIVE) (ENSURE ENLIVE) liquid 237 mL  237 mL Oral PRN Dorann Ou, RD      . FLUoxetine (PROZAC) capsule 20 mg  20 mg Oral Daily Ambrose Finland, MD   20 mg at 08/29/14 0831  . folic acid (FOLVITE) tablet 1 mg  1 mg Oral Daily Venetia Maxon Rama, MD   1 mg at 08/29/14 0832  . hydrochlorothiazide (HYDRODIURIL) tablet 25 mg  25 mg Oral Daily Venetia Maxon Rama, MD   25 mg at 08/29/14 0831  . LORazepam (ATIVAN) injection 0-4 mg  0-4 mg Intravenous Q12H Venetia Maxon Rama, MD   0 mg at 08/27/14 1851  . metoprolol (LOPRESSOR) injection 5 mg  5 mg Intravenous Q4H PRN Waldon Merl, PA-C   5 mg at 08/28/14 1330  . metoprolol (LOPRESSOR) tablet 50 mg  50 mg Oral BID Belva Crome, MD      . mirtazapine (REMERON) tablet 7.5 mg  7.5 mg Oral QHS Ambrose Finland, MD   7.5 mg at 08/28/14 2142  . multivitamin with minerals tablet 1 tablet  1 tablet Oral Daily Venetia Maxon Rama, MD   1 tablet at 08/29/14 0831  . ondansetron (ZOFRAN) tablet 4 mg  4 mg Oral Q6H PRN Venetia Maxon Rama, MD       Or  . ondansetron (ZOFRAN) injection 4 mg  4 mg Intravenous Q6H PRN Venetia Maxon Rama, MD   4 mg at 08/25/14 1908  . pantoprazole (PROTONIX) EC tablet 40 mg  40 mg Oral Q0600 Venetia Maxon Rama, MD   40 mg at 08/29/14 0546  . prednisoLONE tablet 40 mg  40 mg Oral Daily Venetia Maxon Rama, MD   40 mg at 08/29/14 1057  . thiamine (VITAMIN B-1) tablet 100 mg  100 mg Oral Daily Venetia Maxon Rama, MD   100 mg at 08/29/14 0831   Or  . thiamine (B-1) injection 100 mg  100 mg Intravenous Daily Venetia Maxon Rama, MD   100 mg at 08/25/14 2030  . warfarin (COUMADIN) tablet 5 mg  5 mg Oral ONCE-1800 Berton Mount, RPH      . Warfarin - Pharmacist Dosing Inpatient   Does not apply q1800 Berton Mount, [facility]        Musculoskeletal: Strength & Muscle Tone: decreased Gait & Station: unable to stand Patient leans: N/A  Psychiatric Specialty Exam: Physical Exam as per history and physical   ROS depression, anxiety decreased psychomotor activity and recent status post alcohol withdrawal symptoms   Blood pressure 109/68, pulse 52, temperature 98.2 F (36.8 C), temperature source Oral, resp. rate 18, height _0  (1.727 m), weight 87.4 kg (192 lb 10.9 oz), SpO2 99 %.Body mass index is 29.3 kg/(m^2).  General Appearance: Guarded  Eye Contact::  Good  Speech:  Clear and Coherent  Volume:  Decreased  Mood:  Anxious and Depressed  Affect:  Appropriate and Congruent  Thought Process:   Coherent and Goal Directed  Orientation:  Full (Time, Place, and Person)  Thought Content:  WDL  Suicidal Thoughts:  No  Homicidal Thoughts:  No  Memory:  Immediate;   Good  Recent;   Good  Judgement:  Fair  Insight:  Lacking  Psychomotor Activity:  Decreased  Concentration:  Good  Recall:  Good  Fund of Knowledge:Good  Language: Good  Akathisia:  Negative  Handed:  Right  AIMS (if indicated):     Assets:  Communication Skills Desire for Improvement Financial Resources/Insurance Housing Intimacy Leisure Time Resilience Social Support Talents/Skills Transportation  ADL's:  Impaired  Cognition: Impaired,  Mild  Sleep:      Medical Decision Making: New problem, with additional work up planned, Review of Psycho-Social Stressors (1), Review or order clinical lab tests (1), Established Problem, Worsening (2), New Problem, with no additional work-up planned (3), Review or order medicine tests (1), Review of Medication Regimen & Side Effects (2) and Review of New Medication or Change in Dosage (2)  Treatment Plan Summary: Daily contact with patient to assess and evaluate symptoms and progress in treatment and Medication management  Plan:  Continue fluoxetine 20 mg daily for depression  Continue Remeron 7.5 mg at bedtime for insomnia Monitor for the adverse effect of the medication. Patient does not meet criteria for psychiatric inpatient admission. Supportive therapy provided about ongoing stressors. Appreciate psychiatric consultation and follow up as clinically required Please contact [phone] or [phone] if needs further assistance  Dispositipatient will be referred to the outpatient psychiatric services and medically stable and psychiatric consultation and psychiatric social service follow-up as needed.  [employer],JANARDHAHA R. 08/29/2014 12:41 PM

## 2014-08-29 NOTE — Progress Notes (Signed)
Progress Note   [name]:[id number] DOB: 01/21/1951 DOA: 08/25/2014 PCP: No primary care provider on file.   Brief Narrative:   Benjamin Blackwell is an 64 y.o. male a PMH of alcohol dependency who was admitted 08/25/14 after suffering from an alcohol withdrawal seizure. On initial presentation, the patient was found to be in atrial fibrillation with RVR.  Assessment/Plan:   Principal Problem:  Alcohol withdrawal seizure / Alcohol dependency - Continue seizure precautions. - Continue to detox with Ativan per CIWA protocol. - Supplementing thiamine, folic acid.  Active Problems:   Metabolic acidosis - Likely from elevated lactate levels from seizure activity. Resolved.   Atrial fibrillation/flutter with RVR / demand ischemia - TSH WNL.   - Mild elevation of troponins noted along with ST elevations in the inferior leads on repeat 12-lead EKG, likely demand ischemia.  - 2-D echo shows normal LA size, mild LVH with a normal EF. Slight elevation of BNP noted. - Heart rate 60s-80s on Cardizem drip for rate control.  Transition to oral Cardizem and add PO metoprolol. - Continue aspirin, Cardizem, metoprolol and bivalirudin. - Cardiology following. - Will start coumadin. - On Lipitor per cardiology, OK to continue for now since LFTs improved.   Hypertension  - Continue Cardizem, clonidine (dose increased) and metoprolol (dose increased).  Add HCTZ.   Hypokalemia  - Repleted. Magnesium okay.    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   - Discriminant function is 32.  Continue steroids and PPI therapy. - LFTs and ProTime improving.    Hyperglycemia/prediabetes - Hemoglobin A1c 5.8%. Benjamin Blackwell get dietitian consult for dietary counseling.   Thrombocytopenia - Likely from toxic effects of ETOH on bone marrow. - Stable, improving.   DVT prophylaxis - Anticoagulated with bivalirudin.  Code Status:  Full. Family Communication: Daughter Danford Bad updated at bedside 08/26/14. Disposition Plan: Home when stable.   IV Access:    Peripheral IV   Procedures and diagnostic studies:   Ct Head Wo Contrast 08/25/2014: 1. 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 No significant mass effect to suggest acute or subacute process. 2.  Cerebral atrophy and small vessel ischemic change.   Dg Chest Port 1 View 08/25/2014: Moderate cardiomegaly with central vascular congestion but no focal acute finding.     2-D echo 08/27/14: Study Conclusions  - Left ventricle: The cavity size was normal. There was mild concentric hypertrophy. Systolic function was normal. The estimated ejection fraction was in the range of 50% to 55%. Wall motion was normal; there were no regional wall motion abnormalities. - Aortic valve: Trileaflet; mildly thickened, mildly calcified leaflets. - Mitral valve: There was trivial regurgitation  Medical Consultants:    Kathleene Hazel, MD, Cardiology  Anti-Infectives:    None.  Subjective:   Benjamin Blackwell denies chest pain or shortness of breath.  No nausea or vomiting.  Appetite good.  No tremors noted. Asking questions about cardioversion.   Objective:    Filed Vitals:   08/28/14 1455 08/28/14 1835 08/29/14 0020 08/29/14 0415  BP: 160/94 138/87 126/89 132/85  Pulse: 74 69 58 55  Temp: 97.9 F (36.6 C) 98.2 F (36.8 C) 97.8 F (36.6 C) 97.9 F (36.6 C)  TempSrc: Oral Oral Oral Oral  Resp: Height:      Weight:      SpO2: 100% 99% 100% 100%    Intake/Output Summary (Last 24 hours) at  08/29/14 0828 Last data filed at 08/29/14 0416  Gross per 24 hour  Intake    480 ml  Output   1850 ml  Net  -1370 ml    Exam: Gen:  NAD Cardiovascular:  RRR Respiratory:  Lungs CTAB Gastrointestinal:  Abdomen soft, NT/ND, + BS Extremities:  No C/E/C   Data Reviewed:    Labs: Basic Metabolic  Panel:  Recent Labs Lab 08/25/14 1510 08/26/14 0346 08/27/14 0330 08/28/14 0333 08/29/14 0446  NA 143 143 139 138 138  K 4.1 3.2* 3.6 3.9 3.6  CL 105 106 106 103 102  CO2 13* 24 25 24 25   GLUCOSE 190* 121* 122* 134* 148*  BUN 7 7 7 11 19   CREATININE 1.21 0.98 0.92 0.82 1.08  CALCIUM 9.3 9.1 8.8 9.2 9.3  MG  --  2.0  --   --   --    GFR Estimated Creatinine Clearance: 76.2 mL/min (by C-G formula based on Cr of 1.08). Liver Function Tests:  Recent Labs Lab 08/25/14 1853 08/27/14 0330 08/28/14 0333 08/29/14 0446  AST 280* 1338* 444* 203*  ALT 191* 773* 551* 381*  ALKPHOS 70 58 58 62  BILITOT 1.7* 1.6* 1.3* 0.8  PROT 9.0* 7.5 7.8 7.8  ALBUMIN 4.5 3.6 3.6 3.7   Coagulation profile  Recent Labs Lab 08/27/14 0330 08/28/14 0333 08/29/14 0446  INR 1.54* 1.48 1.10    CBC:  Recent Labs Lab 08/25/14 1510 08/26/14 0346 08/27/14 0330 08/28/14 0333 08/29/14 0446  WBC 3.5* 6.0 5.2 4.8 6.0  HGB 14.4 14.5 14.0 14.5 14.8  HCT 42.9 41.1 41.0 41.1 42.2  MCV 99.5 96.3 98.1 95.8 95.5  PLT 108* 103* 110* 134* 172   Cardiac Enzymes:  Recent Labs Lab 08/25/14 1853 08/26/14 0100 08/26/14 0800  TROPONINI 0.07* 0.12* 0.10*   CBG:  Recent Labs Lab 08/25/14 1445  GLUCAP 178*    Microbiology Recent Results (from the past 240 hour(s))  MRSA PCR Screening     Status: None   Collection Time: 08/25/14  6:40 PM  Result Value Ref Range Status   MRSA by PCR NEGATIVE NEGATIVE Final    Comment:        The GeneXpert MRSA Assay (FDA approved for NASAL specimens only), is one component of a comprehensive MRSA colonization surveillance program. It is not intended to diagnose MRSA infection nor to guide or monitor treatment for MRSA infections.      Medications:   . aspirin  81 mg Oral Daily  . atorvastatin  10 mg Oral q1800  . cloNIDine  0.2 mg Oral BID  . FLUoxetine  20 mg Oral Daily  . folic acid  1 mg Oral Daily  . hydrochlorothiazide  25 mg Oral Daily   . LORazepam  0-4 mg Intravenous Q12H  . metoprolol tartrate  50 mg Oral BID  . mirtazapine  7.5 mg Oral QHS  . multivitamin with minerals  1 tablet Oral Daily  . pantoprazole  40 mg Oral Q0600  . prednisoLONE  40 mg Oral Daily  . thiamine  100 mg Oral Daily   Or  . thiamine  100 mg Intravenous Daily   Continuous Infusions:    Time spent: 25 minutes.    LOS: 4 days   Benjamin Achey  [facility] Pager [phone]. If unable to reach me by pager, please call my cell phone at [phone].  *Please refer to [email or web address], password [email or web address] to get updated schedule on who will round on this patient, as hospitalists  switch teams weekly. If 7PM-7AM, please contact night-coverage at [email or web address], password [email or web address] for any overnight needs.  08/29/2014, 8:28 AM

## 2014-08-29 NOTE — Progress Notes (Signed)
ANTICOAGULATION CONSULT NOTE - Follow Up Consult  Pharmacy Consult for Bivalirudin/warfarin (resume 4/7) Indication: afib   Allergies  Allergen Reactions  . Beef-Derived Products Hives  . Pork-Derived Products Hives    Patient Measurements: Height: 5\' 8"  (172.7 cm) Weight: 192 lb 10.9 oz (87.4 kg) IBW/kg (Calculated) : 68.4  Vital Signs: Temp: 98.2 F (36.8 C) (04/07 1117) Temp Source: Oral (04/07 1117) BP: 109/68 mmHg (04/07 1117) Pulse Rate: 52 (04/07 1117)  Labs:  Recent Labs  08/27/14 0330 08/28/14 0333 08/29/14 0446 08/29/14 1408  HGB 14.0 14.5 14.8  --   HCT 41.0 41.1 42.2  --   PLT 110* 134* 172  --   APTT 59* 58* 26 47*  LABPROT 18.6* 18.0* 14.3  --   INR 1.54* 1.48 1.10  --   CREATININE 0.92 0.82 1.08  --     Estimated Creatinine Clearance: 76.2 mL/min (by C-G formula based on Cr of 1.08).  Medications:  Infusions:  . bivalirudin (ANGIOMAX) infusion 0.5 mg/mL (Non-ACS indications) 0.15 mg/kg/hr (08/29/14 1124)    Assessment: [age] yoM admitted with alcohol withdrawal seizure found to be in afib with RVR and elevated troponins.  Bivalirudin started 4/4 for r/o ACS since patient has pork allergy and liver dysfunction.  Warfarin initiated 4/6 but anticoagulation was subsequently d/c'd  same day d/t concern of compliance and alcohol abuse. On 4/7 cardiology decided to pursue DCCV so recommend resume Bivalirudin and warfarin. ASA was stopped   APTT was previous therapeutic on bivalirudin infusing at 0.15 mg/kg/h  Today, 08/29/2014:   INR = 1.1, given dose 4/6  1st aPTT following bivalirudin restart = 47sec on 0.15mg /kg/hr  CBC: Hgb WNL.  Platelets improved to WNL.  Thrombocytopenia suspected d/t h/o ETOH abuse.    SCr WNL  No bleeding/complications reported.  Possible drug interactions with warfarin: steroids (can increase anticoagulant effect)  Goal of Therapy:  INR 2-3 aPTT 50-85 seconds Monitor platelets by anticoagulation protocol: Yes    Plan:   Increase bivalirudin to 0.17mg /kg/hr (14.535 mg/hr)  Check aPTT q4h until therapeutic x 2, then Daily aPTT and CBC.  Warfarin 5mg  PO x 1 - watch INR trend closely d/t liver dysfunction and EtOH abuse  Daily INR  F/u plans for TEE/DCCV - ? 4/8  Coumadin book/video provided 4/6  Juliette Alcideustin [place] Doig, PharmD, BCPS.   Pager: [phone]  08/29/2014,2:44 PM

## 2014-08-29 NOTE — Progress Notes (Signed)
       Patient Name: Benjamin Blackwell Date of Encounter: 08/29/2014    SUBJECTIVE: No CV complaints  TELEMETRY:  A flutter with slow VR: Filed Vitals:   08/28/14 1455 08/28/14 1835 08/29/14 0020 08/29/14 0415  BP: 160/94 138/87 126/89 132/85  Pulse: 74 69 58 55  Temp: 97.9 F (36.6 C) 98.2 F (36.8 C) 97.8 F (36.6 C) 97.9 F (36.6 C)  TempSrc: Oral Oral Oral Oral  Resp: 20 20 19 20   Height:      Weight:      SpO2: 100% 99% 100% 100%    Intake/Output Summary (Last 24 hours) at 08/29/14 0738 Last data filed at 08/29/14 0416  Gross per 24 hour  Intake    480 ml  Output   1850 ml  Net  -1370 ml   LABS: Basic Metabolic Panel:  Recent Labs  16/02/9603/06/16 0333 08/29/14 0446  NA 138 138  K 3.9 3.6  CL 103 102  CO2 24 25  GLUCOSE 134* 148*  BUN 11 19  CREATININE 0.82 1.08  CALCIUM 9.2 9.3   CBC:  Recent Labs  08/28/14 0333 08/29/14 0446  WBC 4.8 6.0  HGB 14.5 14.8  HCT 41.1 42.2  MCV 95.8 95.5  PLT 134* 172   Cardiac Enzymes:  Recent Labs  08/26/14 0800  TROPONINI 0.10*    Radiology/Studies:  No new  Physical Exam: Blood pressure 132/85, pulse 55, temperature 97.9 F (36.6 C), temperature source Oral, resp. rate 20, height 5\' 8"  (1.727 m), weight 192 lb 10.9 oz (87.4 kg), SpO2 100 %. Weight change:   Wt Readings from Last 3 Encounters:  08/28/14 192 lb 10.9 oz (87.4 kg)    Irregular and slow VR  ASSESSMENT:  1. Atrial flutter with slow VR 2. Thrombocytopenia resolved 3. ETOH use and seizure  Plan:  1. Coumadin startted but not anticoagulated currently. 2. IV heparin while coumadin becomes effective. 3. Consider TEE cardioversion tomorrow if remains in A flutter.  Selinda EonSigned, SMITH III,HENRY W 08/29/2014, 7:38 AM

## 2014-08-29 NOTE — Progress Notes (Signed)
Brief Pharmacy Note: Bivalirudin infusion: aPTT result  Bivalirudin infusion increased to 0.17 mg/kg/hr at 1500 for aPTT below 50 seconds  Follow up aPTT 48 seconds, still below goal. Infusion at 29.101ml hr as ordered.  Increase to 0.2 mg/kg/hr -> 34.2 ml/hr, recheck aPTT at 0100  Thank you,  Otho BellowsGreen, Chong Wojdyla L PharmD Pager [phone] 08/29/2014, 8:54 PM

## 2014-08-29 NOTE — Progress Notes (Signed)
PT Cancellation Note  Patient Details Name: Benjamin Blackwell MRN: [id number] DOB: 06/14/1950   Cancelled Treatment:    Reason Eval/Treat Not Completed: Medical issues which prohibited therapy (pt in a-flutter and brady per monitor, RN reports to hold therapy today)   Jaece Ducharme,KATHrine E 08/29/2014, 3:08 PM Zenovia JarredKati Jimmylee Ratterree, PT, DPT 08/29/2014 Pager: [phone]

## 2014-08-29 NOTE — Progress Notes (Signed)
Dr Darnelle Catalanama Notified of pt having an r-r of 2.44

## 2014-08-29 NOTE — Progress Notes (Signed)
ANTICOAGULATION CONSULT NOTE - Follow Up Consult  Pharmacy Consult for Bivalirudin/warfarin (resume 4/7) Indication: afib   Allergies  Allergen Reactions  . Beef-Derived Products Hives  . Pork-Derived Products Hives    Patient Measurements: Height: 5\' 8"  (172.7 cm) Weight: 192 lb 10.9 oz (87.4 kg) IBW/kg (Calculated) : 68.4  Vital Signs: Temp: 97.9 F (36.6 C) (04/07 0415) Temp Source: Oral (04/07 0415) BP: 116/78 mmHg (04/07 0832) Pulse Rate: 74 (04/07 0832)  Labs:  Recent Labs  08/27/14 0330 08/28/14 0333 08/29/14 0446  HGB 14.0 14.5 14.8  HCT 41.0 41.1 42.2  PLT 110* 134* 172  APTT 59* 58* 26  LABPROT 18.6* 18.0* 14.3  INR 1.54* 1.48 1.10  CREATININE 0.92 0.82 1.08    Estimated Creatinine Clearance: 76.2 mL/min (by C-G formula based on Cr of 1.08).  Medications:  Infusions:     Assessment: [age] yoM admitted with alcohol withdrawal seizure found to be in afib with RVR and elevated troponins.  Bivalirudin started 4/4 for r/o ACS since patient has pork allergy and liver dysfunction.  Warfarin initiated 4/6 but anticoagulation was subsequently d/c'd  same day d/t concern of compliance and alcohol abuse. On 4/7 cardiology decided to pursue DCCV so recommend resume Bivalirudin and warfarin. ASA was stopped   APTT was previous therapeutic on bivalirudin infusing at 0.15 mg/kg/h  Today, 08/29/2014:   INR = 1.1, given dose 4/6  CBC: Hgb WNL.  Platelets improved to WNL.  Thrombocytopenia suspected d/t h/o ETOH abuse.    SCr WNL  No bleeding/complications reported.  Possible drug interactions with warfarin: steroids (can increase anticoagulant effect)  Goal of Therapy:  INR 2-3 aPTT 50-85 seconds Monitor platelets by anticoagulation protocol: Yes   Plan:   Resume Bivalirudin infusion at 0.15mg /kg/hr as was previously therapeutic at this rate  Check aPTT 2h after initiation then q4h until therapeutic x 2, then Daily aPTT and CBC.  Warfarin 5mg  PO x 1 -  watch INR trend closely d/t liver dysfunction and EtOH abuse  Daily INR  F/u plans for TEE/DCCV - ? 4/8  Coumadin book/video provided 4/6  Juliette Alcideustin Zeigler, PharmD, BCPS.   Pager: [phone]  08/29/2014,10:12 AM

## 2014-08-29 NOTE — Progress Notes (Signed)
Patient on A Flutter from 40's-90's sustaining most in the 50's, has a 3.65 sec sinus    08/29/14 0415  Vitals  Temp 97.9 F (36.6 C)  Temp Source Oral  BP 132/85 mmHg  BP Location Right Arm  BP Method Automatic  Patient Position (if appropriate) Lying  Pulse Rate (!) 55  Pulse Rate Source Dinamap  Resp 20  Oxygen Therapy  SpO2 100 %  O2 Device Room Air   pause at 0353. Patient asymptomatic. [facility] and made aware with instruction to call cardiologist. Cardiology on call paged Dr Sherlie BanMcClain with order to hold lopressor at 0400. Patient alert, oriented, denies chest pain,dizziness,lightheadedness. Will cont to monitor patient closely.

## 2014-08-30 LAB — CBC
HCT: 41.9 % (ref 39.0–52.0)
Hemoglobin: 15 g/dL (ref 13.0–17.0)
MCH: 34.1 pg — ABNORMAL HIGH (ref 26.0–34.0)
MCHC: 35.8 g/dL (ref 30.0–36.0)
MCV: 95.2 fL (ref 78.0–100.0)
Platelets: 174 K/uL (ref 150–400)
RBC: 4.4 MIL/uL (ref 4.22–5.81)
RDW: 12.6 % (ref 11.5–15.5)
WBC: 5.8 K/uL (ref 4.0–10.5)

## 2014-08-30 LAB — PROTIME-INR
INR: 1.5 — ABNORMAL HIGH (ref 0.00–1.49)
Prothrombin Time: 18.3 seconds — ABNORMAL HIGH (ref 11.6–15.2)

## 2014-08-30 LAB — APTT
APTT: 53 s — AB (ref 24–37)
aPTT: 52 seconds — ABNORMAL HIGH (ref 24–37)

## 2014-08-30 MED ORDER — WARFARIN SODIUM 5 MG PO TABS
5.0000 mg | ORAL_TABLET | Freq: Once | ORAL | Status: AC
  Administered 2014-08-30: 5 mg via ORAL
  Filled 2014-08-30: qty 1

## 2014-08-30 MED ORDER — SODIUM CHLORIDE 0.9 % IV SOLN
INTRAVENOUS | Status: DC
  Administered 2014-09-02: 500 mL via INTRAVENOUS

## 2014-08-30 MED ORDER — METOPROLOL TARTRATE 50 MG PO TABS
50.0000 mg | ORAL_TABLET | Freq: Two times a day (BID) | ORAL | Status: DC
  Administered 2014-08-30 – 2014-09-05 (×12): 50 mg via ORAL
  Filled 2014-08-30 (×13): qty 1

## 2014-08-30 NOTE — Progress Notes (Signed)
PT Cancellation Note  Patient Details Name: Benjamin Blackwell MRN: [id number] DOB: 08/17/1950   Cancelled Treatment:    Reason Eval/Treat Not Completed: Medical issues which prohibited therapy (RN reports pt in aflutter this morning [facility])   Travor Royce,KATHrine E 08/30/2014, 9:54 AM Zenovia JarredKati Shenay Torti, PT, DPT 08/30/2014 Pager: [phone]

## 2014-08-30 NOTE — Progress Notes (Signed)
CSW met with Pt to discuss substance abuse treatment options. Pt expressed that he wanted to "figure it out on his own." Pt declined lists of resources or help with scheduling appointments. Pt reports that there is a facility in [place] that he is going to go to. Pt identified his current medical situation as motivation for him to follow-up at this facility. Pt was pleasant and cooperative.   CSW will continue to follow Pt while hospitalized and assess for needs.  Peri Maris, [place] 08/30/2014 11:53 AM [phone]

## 2014-08-30 NOTE — Progress Notes (Signed)
PT Cancellation Note  Patient Details Name: Benjamin Blackwell MRN: [id number] DOB: 03/08/1951   Cancelled Treatment:    Reason Eval/Treat Not Completed: Medical issues which prohibited therapy . Pt still in flutter, HR improved a bit, however checked in with the nurse and pt still awaiting TEE. Hold PT eval and mobilization today.    Benjamin Blackwell, Benjamin Blackwell 08/30/2014, 2:46 PM  Benjamin Blackwell, PT Pager: [phone] 08/30/2014

## 2014-08-30 NOTE — Progress Notes (Signed)
Patient NPO for TEE today. Spoke with [facility] endo and they called [facility] endo to try and figure out a time for TEE today. Did not hear anything back. Called [facility] endo around 11am and spoke to [place]. She stated that she still did not know if he would be scheduled today. Judeth CornfieldStephanie called back and said that they would be unable to do is TEE today. Paged Dr. Darnelle Catalanama. Diet re-ordered. Meds given. Patient updated.

## 2014-08-30 NOTE — Discharge Instructions (Signed)

## 2014-08-30 NOTE — Consult Note (Signed)
Psychiatry Consult follow-up  Reason for Consult:  Alcohol withdrawal with seizure, alcoholic hepatitis Referring Physician:  Dr. Rockne Menghini Patient Identification: Benjamin Blackwell MRN:  [id number] Principal Diagnosis: Alcohol withdrawal seizure Diagnosis:   Patient Active Problem List   Diagnosis Date Noted  . Seizure [R56.9]   . Prediabetes [R73.09] 08/28/2014  . SOB (shortness of breath) [R06.02]   . Alcoholic hepatitis [T24.58] 08/27/2014  . Hypokalemia [E87.6] 08/26/2014  . Elevated LFTs [R79.89] 08/26/2014  . Metabolic acidosis [K99.8] 08/26/2014  . Atrial fibrillation with RVR [I48.91] 08/25/2014  . Alcohol withdrawal seizure [F10.239] 08/25/2014  . Alcohol dependency [F10.20] 08/25/2014  . Hypertension [I10] 08/25/2014  . Hyperglycemia [R73.9] 08/25/2014  . Thrombocytopenia [D69.6] 08/25/2014    Total Time spent with patient: 30 minutes  Subjective:   Benjamin Blackwell is a 64 y.o. male patient admitted with status post alcohol withdrawal seizures.  HPI:  Benjamin Blackwell is an 64 y.o. male seen, chart reviewed and case discussed with the Dr. Rockne Menghini and psychiatric social service. Patient reported he has been depressed secondary to losing his mother and father when he was young. Patient reportedly dropped out of school and started working as a teenager. Patient married and has a child who is 54 years old. Patient reportedly never received medication management for depression or counseling. Patient denies current symptoms of suicidal, homicidal ideation, intention or plans. Patient reportedly drinking mostly 3 beer before going to the work and 6 pack after work since he was 64 years old. Patient has a previous of sober one time about 7 days and other time about 6 months. Patient was involved with DWI twice and was forced 4 substance abuse classes in the past. 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 Patient reportedly visiting [place] to attend a nephew wedding and then filed down with the alcohol withdrawal seizure. Patient reported he has a past history of alcohol withdrawal seizures 2. Patient stated he promised his wife is going to quit for good now and willing to receive substance abuse rehabilitation treatment upon discharge from the hospital in his county. Patient reported his wife is supportive to him. Patient is willing to try antidepressant medication for depression and insomnia.   08/30/2014  Interval history: Patient feels like not talking today and has been depressed and anxious. Patient reported he has a cardiac procedure probably tomorrow and will be transferred to [facility] for the procedure. Patient stated his sister has been at work and states it is okay to talk to her if needed. Patient was seen today for psychiatric consultation follow-up with her psychiatric social service. Patient has been taking medication as prescribed, tolerated well and has no reported side effects. Patient denied alcohol withdrawal seizure since admission. Psychiatric social service following with the patient regarding outpatient substance abuse and mental health resources. Patient has no safety concerns, and denied current suicidal/homicidal ideation, intention or plans. Patient has no evidence of psychotic symptoms.   Past Medical History:  Past Medical History  Diagnosis Date  . Hypertension   . ETOH abuse   . Seizures     ETOH related  . A-fib     Past Surgical History  Procedure Laterality Date  . Shoulder surgery      left shoulder   Family History:  Family History  Problem Relation Age of Onset  . Heart attack Mother   .  Heart attack Father   . Heart attack Brother   . Heart attack Brother   . Diabetes Father   . Diabetes Brother   . Diabetes Sister    Social History:  History  Alcohol Use  . 0.0 oz/week  . 0  Standard drinks or equivalent per week    Comment: Daily, 12 pack a day, sometimes more, occasional liquor     History  Drug Use  . Yes    Comment: Marijuana    History   Social History  . Marital Status: Divorced    Spouse Name: N/A  . Number of Children: 1  . Years of Education: N/A   Occupational History  . Laborer    Social History Main Topics  . Smoking status: Never Smoker   . Smokeless tobacco: Not on file  . Alcohol Use: 0.0 oz/week    0 Standard drinks or equivalent per week     Comment: Daily, 12 pack a day, sometimes more, occasional liquor  . Drug Use: Yes     Comment: Marijuana  . Sexual Activity: Not on file   Other Topics Concern  . None   Social History Narrative   Lives with his common law girlfriend.  Employed full time as a Arts administrator.     Additional Social History:                          Allergies:   Allergies  Allergen Reactions  . Beef-Derived Products Hives  . Pork-Derived [employer]:  Results for orders placed or performed during the hospital encounter of 08/25/14 (from the past 48 hour(s))  APTT     Status: None   Collection Time: 08/29/14  4:46 AM  Result Value Ref Range   aPTT 26 24 - 37 seconds  CBC     Status: None   Collection Time: 08/29/14  4:46 AM  Result Value Ref Range   WBC 6.0 4.0 - 10.5 K/uL   RBC 4.42 4.22 - 5.81 MIL/uL   Hemoglobin 14.8 13.0 - 17.0 g/dL   HCT 42.2 39.0 - 52.0 %   MCV 95.5 78.0 - 100.0 fL   MCH 33.5 26.0 - 34.0 pg   MCHC 35.1 30.0 - 36.0 g/dL   RDW 12.4 11.5 - 15.5 %   Platelets 172 150 - 400 K/uL  Comprehensive metabolic panel     Status: Abnormal   Collection Time: 08/29/14  4:46 AM  Result Value Ref Range   Sodium 138 135 - 145 mmol/L   Potassium 3.6 3.5 - 5.1 mmol/L   Chloride 102 96 - 112 mmol/L   CO2 25 19 - 32 mmol/L   Glucose, Bld 148 (H) 70 - 99 mg/dL   BUN 19 6 - 23 mg/dL   Creatinine, Ser 1.08 0.50 - 1.35 mg/dL   Calcium 9.3 8.4 - 10.5 mg/dL   Total Protein  7.8 6.0 - 8.3 g/dL   Albumin 3.7 3.5 - 5.2 g/dL   AST 203 (H) 0 - 37 U/L   ALT 381 (H) 0 - 53 U/L   Alkaline Phosphatase 62 39 - 117 U/L   Total Bilirubin 0.8 0.3 - 1.2 mg/dL   GFR calc non Af Amer 72 (L) >90 mL/min   GFR calc Af Amer 83 (L) >90 mL/min    Comment: (NOTE) The eGFR has been calculated using the CKD EPI equation. This calculation has not been validated in all clinical  situations. eGFR's persistently <90 mL/min signify possible Chronic Kidney Disease.    Anion gap 11 5 - 15  Protime-INR     Status: None   Collection Time: 08/29/14  4:46 AM  Result Value Ref Range   Prothrombin Time 14.3 11.6 - 15.2 seconds   INR 1.10 0.00 - 1.49  APTT     Status: Abnormal   Collection Time: 08/29/14  2:08 PM  Result Value Ref Range   aPTT 47 (H) 24 - 37 seconds    Comment:        IF BASELINE aPTT IS ELEVATED, SUGGEST PATIENT RISK ASSESSMENT BE USED TO DETERMINE APPROPRIATE ANTICOAGULANT THERAPY.   APTT     Status: Abnormal   Collection Time: 08/29/14  7:12 PM  Result Value Ref Range   aPTT 48 (H) 24 - 37 seconds    Comment:        IF BASELINE aPTT IS ELEVATED, SUGGEST PATIENT RISK ASSESSMENT BE USED TO DETERMINE APPROPRIATE ANTICOAGULANT THERAPY.   APTT     Status: Abnormal   Collection Time: 08/30/14 12:47 AM  Result Value Ref Range   aPTT 53 (H) 24 - 37 seconds    Comment:        IF BASELINE aPTT IS ELEVATED, SUGGEST PATIENT RISK ASSESSMENT BE USED TO DETERMINE APPROPRIATE ANTICOAGULANT THERAPY.   CBC     Status: Abnormal   Collection Time: 08/30/14 12:47 AM  Result Value Ref Range   WBC 5.8 4.0 - 10.5 K/uL   RBC 4.40 4.22 - 5.81 MIL/uL   Hemoglobin 15.0 13.0 - 17.0 g/dL   HCT 41.9 39.0 - 52.0 %   MCV 95.2 78.0 - 100.0 fL   MCH 34.1 (H) 26.0 - 34.0 pg   MCHC 35.8 30.0 - 36.0 g/dL   RDW 12.6 11.5 - 15.5 %   Platelets 174 150 - 400 K/uL  Protime-INR     Status: Abnormal   Collection Time: 08/30/14 12:47 AM  Result Value Ref Range   Prothrombin Time 18.3  (H) 11.6 - 15.2 seconds   INR 1.50 (H) 0.00 - 1.49  APTT     Status: Abnormal   Collection Time: 08/30/14  8:35 AM  Result Value Ref Range   aPTT 52 (H) 24 - 37 seconds    Comment:        IF BASELINE aPTT IS ELEVATED, SUGGEST PATIENT RISK ASSESSMENT BE USED TO DETERMINE APPROPRIATE ANTICOAGULANT THERAPY.     Vitals: Blood pressure 149/97, pulse 77, temperature 98.1 F (36.7 C), temperature source Oral, resp. rate 18, height 5' 8"  (1.727 m), weight 87.4 kg (192 lb 10.9 oz), SpO2 100 %.  Risk to Self: Is patient at risk for suicide?: No Risk to Others:   Prior Inpatient Therapy:   Prior Outpatient Therapy:    Current Facility-Administered Medications  Medication Dose Route Frequency Provider Last Rate Last Dose  . 0.9 %  sodium chloride infusion   Intravenous Continuous Belva Crome, MD      . acetaminophen (TYLENOL) tablet 650 mg  650 mg Oral Q6H PRN Venetia Maxon Rama, MD       Or  . acetaminophen (TYLENOL) suppository 650 mg  650 mg Rectal Q6H PRN Venetia Maxon Rama, MD      . alum & mag hydroxide-simeth (MAALOX/MYLANTA) 200-200-20 MG/5ML suspension 30 mL  30 mL Oral Q6H PRN Venetia Maxon Rama, MD      . atorvastatin (LIPITOR) tablet 10 mg  10 mg Oral q1800 Waldon Merl,  PA-C   10 mg at 08/29/14 1749  . bivalirudin (ANGIOMAX) 250 mg in sodium chloride 0.9 % 500 mL (0.5 mg/mL) infusion  0.2 mg/kg/hr Intravenous Continuous Minda Ditto, RPH 34.2 mL/hr at 08/30/14 0439 0.2 mg/kg/hr at 08/30/14 0439  . cloNIDine (CATAPRES) tablet 0.2 mg  0.2 mg Oral BID Venetia Maxon Rama, MD   0.2 mg at 08/29/14 2121  . feeding supplement (ENSURE ENLIVE) (ENSURE ENLIVE) liquid 237 mL  237 mL Oral PRN Dorann Ou, RD      . FLUoxetine (PROZAC) capsule 20 mg  20 mg Oral Daily Ambrose Finland, MD   20 mg at 08/29/14 0831  . folic acid (FOLVITE) tablet 1 mg  1 mg Oral Daily Venetia Maxon Rama, MD   1 mg at 08/29/14 0832  . hydrochlorothiazide (HYDRODIURIL) tablet 25 mg  25 mg Oral Daily Venetia Maxon  Rama, MD   25 mg at 08/29/14 0831  . metoprolol (LOPRESSOR) injection 5 mg  5 mg Intravenous Q4H PRN Waldon Merl, PA-C   5 mg at 08/28/14 1330  . metoprolol (LOPRESSOR) tablet 50 mg  50 mg Oral BID Belva Crome, MD      . mirtazapine (REMERON) tablet 7.5 mg  7.5 mg Oral QHS Ambrose Finland, MD   7.5 mg at 08/29/14 2121  . multivitamin with minerals tablet 1 tablet  1 tablet Oral Daily Venetia Maxon Rama, MD   1 tablet at 08/29/14 0831  . ondansetron (ZOFRAN) tablet 4 mg  4 mg Oral Q6H PRN Venetia Maxon Rama, MD       Or  . ondansetron (ZOFRAN) injection 4 mg  4 mg Intravenous Q6H PRN Venetia Maxon Rama, MD   4 mg at 08/25/14 1908  . pantoprazole (PROTONIX) EC tablet 40 mg  40 mg Oral Q0600 Venetia Maxon Rama, MD   40 mg at 08/29/14 0546  . prednisoLONE tablet 40 mg  40 mg Oral Daily Venetia Maxon Rama, MD   40 mg at 08/29/14 1057  . thiamine (VITAMIN B-1) tablet 100 mg  100 mg Oral Daily Venetia Maxon Rama, MD   100 mg at 08/29/14 0831   Or  . thiamine (B-1) injection 100 mg  100 mg Intravenous Daily Venetia Maxon Rama, MD   100 mg at 08/25/14 2030  . Warfarin - Pharmacist Dosing Inpatient   Does not apply q1800 Berton Mount, [facility]        Musculoskeletal: Strength & Muscle Tone: decreased Gait & Station: unable to stand Patient leans: N/A  Psychiatric Specialty Exam: Physical Exam as per history and physical   ROS depression, anxiety decreased psychomotor activity and recent status post alcohol withdrawal symptoms   Blood pressure 149/97, pulse 77, temperature 98.1 F (36.7 C), temperature source Oral, resp. rate 18, height 5' 8"  (1.727 m), weight 87.4 kg (192 lb 10.9 oz), SpO2 100 %.Body mass index is 29.3 kg/(m^2).  General Appearance: Guarded  Eye Contact::  Good  Speech:  Clear and Coherent  Volume:  Decreased  Mood:  Anxious and Depressed  Affect:  Appropriate and Congruent  Thought Process:  Coherent and Goal Directed  Orientation:  Full (Time, Place, and Person)  Thought  Content:  WDL  Suicidal Thoughts:  No  Homicidal Thoughts:  No  Memory:  Immediate;   Good Recent;   Good  Judgement:  Fair  Insight:  Lacking  Psychomotor Activity:  Decreased  Concentration:  Good  Recall:  Good  Fund of Knowledge:Good  Language: Good  Akathisia:  Negative  Handed:  Right  AIMS (if indicated):     Assets:  Communication Skills Desire for Improvement Financial Resources/Insurance Housing Intimacy Leisure Time Resilience Social Support Talents/Skills Transportation  ADL's:  Impaired  Cognition: Impaired,  Mild  Sleep:      Medical Decision Making: New problem, with additional work up planned, Review of Psycho-Social Stressors (1), Review or order clinical lab tests (1), Established Problem, Worsening (2), New Problem, with no additional work-up planned (3), Review or order medicine tests (1), Review of Medication Regimen & Side Effects (2) and Review of New Medication or Change in Dosage (2)  Treatment Plan Summary: Daily contact with patient to assess and evaluate symptoms and progress in treatment and Medication management  Plan:  Continue Fluoxetine 20 mg daily for depression  Continue Remeron 7.5 mg at bedtime for insomnia Monitor for the adverse effect of the medication. Patient does not meet criteria for psychiatric inpatient admission. Supportive therapy provided about ongoing stressors. Appreciate psychiatric consultation and follow up as clinically required Please contact [phone] or [phone] if needs further assistance  Dispositipatient will be referred to the outpatient psychiatric services and medically stable and psychiatric consultation and psychiatric social service follow-up as needed.  Tallin Hart,JANARDHAHA R. 08/30/2014 11:13 AM

## 2014-08-30 NOTE — Progress Notes (Addendum)
ANTICOAGULATION CONSULT NOTE - Follow Up Consult  Pharmacy Consult for Bivalirudin/warfarin (resumed 4/7) Indication: afib   Allergies  Allergen Reactions  . Beef-Derived Products Hives  . Pork-Derived Products Hives    Patient Measurements: Height: 5\' 8"  (172.7 cm) Weight: 192 lb 10.9 oz (87.4 kg) IBW/kg (Calculated) : 68.4  Vital Signs: Temp: 98.1 F (36.7 C) (04/08 0500) Temp Source: Oral (04/08 0500) BP: 149/97 mmHg (04/08 0500) Pulse Rate: 77 (04/08 0500)  Labs:  Recent Labs  08/28/14 0333 08/29/14 0446  08/29/14 1912 08/30/14 0047 08/30/14 0835  HGB 14.5 14.8  --   --  15.0  --   HCT 41.1 42.2  --   --  41.9  --   PLT 134* 172  --   --  174  --   APTT 58* 26  < > 48* 53* 52*  LABPROT 18.0* 14.3  --   --  18.3*  --   INR 1.48 1.10  --   --  1.50*  --   CREATININE 0.82 1.08  --   --   --   --   < > = values in this interval not displayed.  Estimated Creatinine Clearance: 76.2 mL/min (by C-G formula based on Cr of 1.08).  Medications:  Infusions:  . sodium chloride    . bivalirudin (ANGIOMAX) infusion 0.5 mg/mL (Non-ACS indications) 0.2 mg/kg/hr (08/30/14 0439)    Assessment: [age] yoM admitted with alcohol withdrawal seizure found to be in afib with RVR and elevated troponins.  Bivalirudin started 4/4 for r/o ACS since patient has pork allergy and liver dysfunction.  Warfarin initiated 4/6 but anticoagulation was subsequently d/c'd  same day d/t concern of compliance and alcohol abuse. On 4/7 cardiology decided to pursue DCCV so recommend resume Bivalirudin and warfarin. ASA was stopped   APTT was previously therapeutic on bivalirudin infusing at 0.15 mg/kg/h  Today, 08/30/2014:   INR = 1.5  aPTT =  52 sec (therapeutic) on 0.2 mg/kg/hr  CBC: Hgb WNL.  Platelets improved to WNL.  Previous Thrombocytopenia suspected d/t h/o ETOH abuse.    SCr WNL  No bleeding/complications reported.  Possible drug interactions with warfarin: steroids (can increase  anticoagulant effect)  Goal of Therapy:  INR 2-3 aPTT 50-85 seconds Monitor platelets by anticoagulation protocol: Yes   Plan:   Continue bivalirudin at 0.20mg /kg/hr (34.2 ml/hr) following two consecutive aPTTs  Check Daily INR, aPTT and CBC.  Warfarin 5mg  PO x 1 - watch INR trend closely d/t liver dysfunction and EtOH abuse.  Suspect bivalirudin is falsely elevating INR (reason for more rapid rise in INR overnight) - previous INR values while on bivalirudin consistent with this then INR normalized (to 1.1) on 4/7 following d/c of bivalirudin 4/6.  (INR interference typically less with bivalirudin vs argatroban)  Possible TEE/DCCV today but RN reports that may not be able to fit into schedule today.    Coumadin book/video provided 4/6  Provide warfarin education - included discussion re: limiting (1 drink/day)/avoiding alcohol with warfarin  Juliette Alcideustin Zeigler, PharmD, BCPS.   Pager: [phone]  08/30/2014,10:13 AM
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
## 2014-08-30 NOTE — Progress Notes (Signed)
       Patient Name: Benjamin Blackwell Date of Encounter: 08/30/2014    SUBJECTIVE: Agreeable to having TE cardioversion today.  TELEMETRY:  Atrial flutter with some RR intervals of 2.75 seconds. Filed Vitals:   08/29/14 1117 08/29/14 1807 08/29/14 2159 08/30/14 0500  BP: 109/68 126/79 132/97 149/97  Pulse: 52 77 81 77  Temp: 98.2 F (36.8 C) 97.6 F (36.4 C) 98.3 F (36.8 C) 98.1 F (36.7 C)  TempSrc: Oral Oral Oral Oral  Resp: 18 18 18 18   Height:      Weight:      SpO2: 99% 99% 100% 100%    Intake/Output Summary (Last 24 hours) at 08/30/14 0845 Last data filed at 08/30/14 [id number]  Gross per 24 hour  Intake    720 ml  Output   1400 ml  Net   -680 ml   LABS: Basic Metabolic Panel:  Recent Labs  65/78/4602/11/07 0333 08/29/14 0446  NA 138 138  K 3.9 3.6  CL 103 102  CO2 24 25  GLUCOSE 134* 148*  BUN 11 19  CREATININE 0.82 1.08  CALCIUM 9.2 9.3   CBC:  Recent Labs  08/29/14 0446 08/30/14 0047  WBC 6.0 5.8  HGB 14.8 15.0  HCT 42.2 41.9  MCV 95.5 95.2  PLT 172 174     Radiology/Studies:  No new data  Physical Exam: Blood pressure 149/97, pulse 77, temperature 98.1 F (36.7 C), temperature source Oral, resp. rate 18, height 5\' 8"  (1.727 m), weight 192 lb 10.9 oz (87.4 kg), SpO2 100 %. Weight change:   Wt Readings from Last 3 Encounters:  08/28/14 192 lb 10.9 oz (87.4 kg)    Neurologically/cognitively clear. Chest clear.  ASSESSMENT:  1. Continues with atrial flutter and variable ventricular response. Has had some RR intervals as long as 2.75 seconds. 2. Alcohol abuse 3. Pork allergy requiring bivalirudin as anticoagulant. 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 Will need chronic anticoagulation for 6 weeks.  Plan:   Should remain in hospital until Coumadin as INR of 2.0.  TEE cardioversion today.  Nothing by mouth. The risks and nature of the procedure have been discussed in detail with the patient and he is in agreement with pursuing  electrical cardioversion.  We'll hold this morning's dose of metoprolol to avoid prolonged bradycardia post cardioversion.  Selinda EonSigned, Jalene Demo III,Kendarious Gudino W 08/30/2014, 8:45 AM

## 2014-08-30 NOTE — Progress Notes (Signed)
ANTICOAGULATION CONSULT NOTE - Follow Up Consult  Pharmacy Consult for Bivalirudin/warfarin (resume 4/7) Indication: afib   Allergies  Allergen Reactions  . Beef-Derived Products Hives  . Pork-Derived Products Hives    Patient Measurements: Height: 5\' 8"  (172.7 cm) Weight: 192 lb 10.9 oz (87.4 kg) IBW/kg (Calculated) : 68.4  Vital Signs: Temp: 98.1 F (36.7 C) (04/08 0500) Temp Source: Oral (04/08 0500) BP: 149/97 mmHg (04/08 0500) Pulse Rate: 77 (04/08 0500)  Labs:  Recent Labs  08/28/14 0333 08/29/14 0446  08/29/14 1912 08/30/14 0047 08/30/14 0835  HGB 14.5 14.8  --   --  15.0  --   HCT 41.1 42.2  --   --  41.9  --   PLT 134* 172  --   --  174  --   APTT 58* 26  < > 48* 53* 52*  LABPROT 18.0* 14.3  --   --  18.3*  --   INR 1.48 1.10  --   --  1.50*  --   CREATININE 0.82 1.08  --   --   --   --   < > = values in this interval not displayed.  Estimated Creatinine Clearance: 76.2 mL/min (by C-G formula based on Cr of 1.08).  Medications:  Infusions:  . sodium chloride    . bivalirudin (ANGIOMAX) infusion 0.5 mg/mL (Non-ACS indications) 0.2 mg/kg/hr (08/30/14 0439)    Assessment: [age] yoM admitted with alcohol withdrawal seizure found to be in afib with RVR and elevated troponins.  Bivalirudin started 4/4 for r/o ACS since patient has pork allergy and liver dysfunction.  Warfarin initiated 4/6 but anticoagulation was subsequently d/c'd  same day d/t concern of compliance and alcohol abuse. On 4/7 cardiology decided to pursue DCCV so recommend resume Bivalirudin and warfarin. ASA was stopped   APTT was previously therapeutic on bivalirudin infusing at 0.15 mg/kg/h  Today, 08/30/2014:   INR = 1.5  aPTT =  52 sec (therapeutic) on 0.2 mg/kg/hr  CBC: Hgb WNL.  Platelets improved to WNL.  Previous Thrombocytopenia suspected d/t h/o ETOH abuse.    SCr WNL  No bleeding/complications reported.  Possible drug interactions with warfarin: steroids (can increase  anticoagulant effect)  Goal of Therapy:  INR 2-3 aPTT 50-85 seconds Monitor platelets by anticoagulation protocol: Yes   Plan:   Continue bivalirudin at 0.20mg /kg/hr (34.2 ml/hr) following two consecutive aPTTs  Check Daily INR, aPTT and CBC.  Warfarin 5mg  PO x 1 - watch INR trend closely d/t liver dysfunction and EtOH abuse.  I suspect bivalirudin is falsely elevating INR - previous INR values while on bivalirudin consistent with this  (INR interference typically less with bivalirudin vs argatroban)  Possible TEE/DCCV today but RN reports that may not be able to fit into schedule today.    Coumadin book/video provided 4/6  Juliette Alcideustin Zeigler, PharmD, BCPS.   Pager: [phone]  08/30/2014,9:28 AM

## 2014-08-30 NOTE — Progress Notes (Signed)
CSW met with Pt along with psych MD. Pt was guarded with CSW and psych MD. Psych MD attempted to get consent for CSW to contact Pt's sister who is listed in the chart. Pt was reluctant to consent but reported that we could contact her. Pt expressed that he was having a heart procedure today and reported that he had become upset when talking with psych MD and CSW on the first day. Pt reported that he would rather not meet with psych MD and CSW today.  CSW to continue following to assess for needs.  Peri Maris, [place] 08/30/2014 11:56 AM [phone]

## 2014-08-30 NOTE — Progress Notes (Signed)
Progress Note   Benjamin Blackwell ZOX:[id number] DOB: 07/07/1950 DOA: 08/25/2014 PCP: No primary care provider on file.   Brief Narrative:   Benjamin Blackwell is an 64 y.o. male a PMH of alcohol dependency who was admitted 08/25/14 after suffering from an alcohol withdrawal seizure. On initial presentation, the patient was found to be in atrial fibrillation with RVR.  Assessment/Plan:   Principal Problem:  Alcohol withdrawal seizure / Alcohol dependency - Continue seizure precautions. - S/P detox with Ativan per CIWA protocol. - Supplementing thiamine, folic acid.  Active Problems:   Metabolic acidosis - Likely from elevated lactate levels from seizure activity. Resolved.   Atrial fibrillation/flutter with RVR / demand ischemia - TSH WNL.   - Mild elevation of troponins noted along with ST elevations in the inferior leads on repeat 12-lead EKG, likely demand ischemia.  - 2-D echo shows normal LA size, mild LVH with a normal EF. Slight elevation of BNP noted. - Heart rate controlled with Cardizem and metoprolol. Continue aspirin, Cardizem, metoprolol and bivalirudin. - Cardiology following. For cardioversion today. - Continue Coumadin. - On Lipitor per cardiology, OK to continue for now since LFTs improved.   Hypertension  - Continue Cardizem, clonidine (dose increased) and metoprolol (dose increased).  Add HCTZ.   Hypokalemia  - Repleted. Magnesium okay.    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   - Discriminant function is 32.  Continue steroids and PPI therapy. - LFTs and ProTime improving. Likely need one week of steroids total.    Hyperglycemia/prediabetes - Hemoglobin A1c 5.8%. - Dietitian counsel patient on 08/28/14.   Thrombocytopenia - Likely from toxic effects of ETOH on bone marrow. - Resolved with avoidance of alcohol.   DVT prophylaxis - Anticoagulated with bivalirudin.  Code Status:  Full. Family Communication: Daughter Benjamin Blackwell updated at bedside 08/26/14. Disposition Plan: Home when stable.   IV Access:    Peripheral IV   Procedures and diagnostic studies:   Ct Head Wo Contrast 08/25/2014: 1. 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 No significant mass effect to suggest acute or subacute process. 2.  Cerebral atrophy and small vessel ischemic change.   Dg Chest Port 1 View 08/25/2014: Moderate cardiomegaly with central vascular congestion but no focal acute finding.     2-D echo 08/27/14: Study Conclusions  - Left ventricle: The cavity size was normal. There was mild concentric hypertrophy. Systolic function was normal. The estimated ejection fraction was in the range of 50% to 55%. Wall motion was normal; there were no regional wall motion abnormalities. - Aortic valve: Trileaflet; mildly thickened, mildly calcified leaflets. - Mitral valve: There was trivial regurgitation  Medical Consultants:    Kathleene Hazelhristopher D McAlhany, MD, Cardiology  Anti-Infectives:    None.  Subjective:   Benjamin Blackwell continues to deny chest pain or shortness of breath.  No nausea or vomiting.  Appetite remains good.  No tremors or DTs noted.   Objective:    Filed Vitals:   08/29/14 1117 08/29/14 1807 08/29/14 2159 08/30/14 0500  BP: 109/68 126/79 132/97 149/97  Pulse: 52 77 81 77  Temp: 98.2 F (36.8 C) 97.6 F (36.4 C) 98.3 F (36.8 C) 98.1 F (36.7 C)  TempSrc: Oral Oral Oral Oral  Resp: 18 18 18 18   Height:      Weight:      SpO2: 99% 99% 100% 100%    Intake/Output Summary (Last 24 hours) at 08/30/14 0749  Last data filed at 08/29/14 2042  Gross per 24 hour  Intake    720 ml  Output   1100 ml  Net   -380 ml    Exam: Gen:  NAD Cardiovascular:  RRR Respiratory:  Lungs CTAB Gastrointestinal:  Abdomen soft, NT/ND, + BS Extremities:  No C/E/C   Data Reviewed:    Labs: Basic Metabolic Panel:  Recent  Labs Lab 08/25/14 1510 08/26/14 0346 08/27/14 0330 08/28/14 0333 08/29/14 0446  NA 143 143 139 138 138  K 4.1 3.2* 3.6 3.9 3.6  CL 105 106 106 103 102  CO2 13* GLUCOSE 190* 121* 122* 134* 148*  BUN CREATININE 1.21 0.98 0.92 0.82 1.08  CALCIUM 9.3 9.1 8.8 9.2 9.3  MG  --  2.0  --   --   --    GFR Estimated Creatinine Clearance: 76.2 mL/min (by C-G formula based on Cr of 1.08). Liver Function Tests:  Recent Labs Lab 08/25/14 1853 08/27/14 0330 08/28/14 0333 08/29/14 0446  AST 280* 1338* 444* 203*  ALT 191* 773* 551* 381*  ALKPHOS 70 58 58 62  BILITOT 1.7* 1.6* 1.3* 0.8  PROT 9.0* 7.5 7.8 7.8  ALBUMIN 4.5 3.6 3.6 3.7   Coagulation profile  Recent Labs Lab 08/27/14 0330 08/28/14 0333 08/29/14 0446 08/30/14 0047  INR 1.54* 1.48 1.10 1.50*    CBC:  Recent Labs Lab 08/26/14 0346 08/27/14 0330 08/28/14 0333 08/29/14 0446 08/30/14 0047  WBC 6.0 5.2 4.8 6.0 5.8  HGB 14.5 14.0 14.5 14.8 15.0  HCT 41.1 41.0 41.1 42.2 41.9  MCV 96.3 98.1 95.8 95.5 95.2  PLT 103* 110* 134* 172 174   Cardiac Enzymes:  Recent Labs Lab 08/25/14 1853 08/26/14 0100 08/26/14 0800  TROPONINI 0.07* 0.12* 0.10*   CBG:  Recent Labs Lab 08/25/14 1445  GLUCAP 178*    Microbiology Recent Results (from the past 240 hour(s))  MRSA PCR Screening     Status: None   Collection Time: 08/25/14  6:40 PM  Result Value Ref Range Status   MRSA by PCR NEGATIVE NEGATIVE Final    Comment:        The GeneXpert MRSA Assay (FDA approved for NASAL specimens only), is one component of a comprehensive MRSA colonization surveillance program. It is not intended to diagnose MRSA infection nor to guide or monitor treatment for MRSA infections.      Medications:   . atorvastatin  10 mg Oral q1800  . cloNIDine  0.2 mg Oral BID  . FLUoxetine  20 mg Oral Daily  . folic acid  1 mg Oral Daily  . hydrochlorothiazide  25 mg Oral Daily  . metoprolol tartrate  50  mg Oral BID  . mirtazapine  7.5 mg Oral QHS  . multivitamin with minerals  1 tablet Oral Daily  . pantoprazole  40 mg Oral Q0600  . prednisoLONE  40 mg Oral Daily  . thiamine  100 mg Oral Daily   Or  . thiamine  100 mg Intravenous Daily  . Warfarin - Pharmacist Dosing Inpatient   Does not apply q1800   Continuous Infusions: . bivalirudin (ANGIOMAX) infusion 0.5 mg/mL (Non-ACS indications) 0.2 mg/kg/hr (08/30/14 0439)    Time spent: 25 minutes.    LOS: 5 days   Zi Newbury  [facility] Pager [phone]. If unable to reach me by pager, please call my cell phone at [phone].  *Please refer to [email or web address], password [email or web address] to get  updated schedule on who will round on this patient, as hospitalists switch teams weekly. If 7PM-7AM, please contact night-coverage at [email or web address], password [email or web address] for any overnight needs.  08/30/2014, 7:49 AM

## 2014-08-30 NOTE — Progress Notes (Signed)
ANTICOAGULATION CONSULT NOTE - Follow Up Consult  Pharmacy Consult for Bivalirudin/warfarin (resume 4/7) Indication: afib   Allergies  Allergen Reactions  . Beef-Derived Products Hives  . Pork-Derived Products Hives    Patient Measurements: Height: 5\' 8"  (172.7 cm) Weight: 192 lb 10.9 oz (87.4 kg) IBW/kg (Calculated) : 68.4  Vital Signs: Temp: 98.3 F (36.8 C) (04/07 2159) Temp Source: Oral (04/07 2159) BP: 132/97 mmHg (04/07 2159) Pulse Rate: 81 (04/07 2159)  Labs:  Recent Labs  08/27/14 0330 08/28/14 0333 08/29/14 0446 08/29/14 1408 08/29/14 1912 08/30/14 0047  HGB 14.0 14.5 14.8  --   --  15.0  HCT 41.0 41.1 42.2  --   --  41.9  PLT 110* 134* 172  --   --  174  APTT 59* 58* 26 47* 48* 53*  LABPROT 18.6* 18.0* 14.3  --   --  18.3*  INR 1.54* 1.48 1.10  --   --  1.50*  CREATININE 0.92 0.82 1.08  --   --   --     Estimated Creatinine Clearance: 76.2 mL/min (by C-G formula based on Cr of 1.08).  Medications:  Infusions:  . bivalirudin (ANGIOMAX) infusion 0.5 mg/mL (Non-ACS indications) 0.2 mg/kg/hr (08/29/14 2121)    Assessment: [age] yoM admitted with alcohol withdrawal seizure found to be in afib with RVR and elevated troponins.  Bivalirudin started 4/4 for r/o ACS since patient has pork allergy and liver dysfunction.  Warfarin initiated 4/6 but anticoagulation was subsequently d/c'd  same day d/t concern of compliance and alcohol abuse. On 4/7 cardiology decided to pursue DCCV so recommend resume Bivalirudin and warfarin. ASA was stopped   APTT was previous therapeutic on bivalirudin infusing at 0.15 mg/kg/h   08/29/14:   INR = 1.1, given dose 4/6  1st aPTT following bivalirudin restart = 47sec on 0.15mg /kg/hr  CBC: Hgb WNL.  Platelets improved to WNL.  Thrombocytopenia suspected d/t h/o ETOH abuse.    SCr WNL  No bleeding/complications reported.  Possible drug interactions with warfarin: steroids (can increase anticoagulant effect) Today,  08/30/14:  aPTT=53 sec (Goal 50-85 sec)   Goal of Therapy:  INR 2-3 aPTT 50-85 seconds Monitor platelets by anticoagulation protocol: Yes   Plan:   Continue Bivalirudin @ 34.2 ml/hr  Check aPTT q4h until therapeutic x 2, then Daily aPTT and CBC.  F/u plans for TEE/DCCV - ? 4/8  Check next aPTT with am labs, if therapeutic can check daily.  Lorenza EvangelistGreen, Jagger Demonte R  08/30/2014,2:54 AM

## 2014-08-31 LAB — CBC
HCT: 45.9 % (ref 39.0–52.0)
Hemoglobin: 16.1 g/dL (ref 13.0–17.0)
MCH: 33.5 pg (ref 26.0–34.0)
MCHC: 35.1 g/dL (ref 30.0–36.0)
MCV: 95.6 fL (ref 78.0–100.0)
PLATELETS: 224 10*3/uL (ref 150–400)
RBC: 4.8 MIL/uL (ref 4.22–5.81)
RDW: 12.8 % (ref 11.5–15.5)
WBC: 8 10*3/uL (ref 4.0–10.5)

## 2014-08-31 LAB — APTT
APTT: 52 s — AB (ref 24–37)
aPTT: 49 seconds — ABNORMAL HIGH (ref 24–37)
aPTT: 52 seconds — ABNORMAL HIGH (ref 24–37)

## 2014-08-31 LAB — PROTIME-INR
INR: 1.64 — AB (ref 0.00–1.49)
Prothrombin Time: 19.6 seconds — ABNORMAL HIGH (ref 11.6–15.2)

## 2014-08-31 MED ORDER — HYDRALAZINE HCL 20 MG/ML IJ SOLN
10.0000 mg | Freq: Four times a day (QID) | INTRAMUSCULAR | Status: DC | PRN
  Administered 2014-08-31 – 2014-09-01 (×2): 10 mg via INTRAVENOUS
  Filled 2014-08-31 (×2): qty 1

## 2014-08-31 MED ORDER — SODIUM CHLORIDE 0.9 % IV SOLN
0.2200 mg/kg/h | INTRAVENOUS | Status: DC
  Administered 2014-08-31 – 2014-09-02 (×5): 0.22 mg/kg/h via INTRAVENOUS
  Filled 2014-08-31 (×6): qty 250

## 2014-08-31 MED ORDER — WARFARIN SODIUM 5 MG PO TABS
5.0000 mg | ORAL_TABLET | Freq: Once | ORAL | Status: AC
  Administered 2014-08-31: 5 mg via ORAL
  Filled 2014-08-31: qty 1

## 2014-08-31 NOTE — Progress Notes (Signed)
ANTICOAGULATION CONSULT NOTE - Follow Up Consult  Pharmacy Consult for Bivalirudin/warfarin (resumed 4/7) Indication: Atrial fibrillation  Allergies  Allergen Reactions  . Beef-Derived Products Hives  . Pork-Derived Products Hives    Patient Measurements: Height: 5\' 8"  (172.7 cm) Weight: 192 lb 10.9 oz (87.4 kg) IBW/kg (Calculated) : 68.4  Vital Signs: Temp: 97.8 F (36.6 C) (04/09 0500) Temp Source: Oral (04/09 0500) BP: 150/100 mmHg (04/09 0500) Pulse Rate: 74 (04/09 0500)  Labs:  Recent Labs  08/29/14 0446  08/30/14 0047 08/30/14 0835 08/31/14 0548  HGB 14.8  --  15.0  --  16.1  HCT 42.2  --  41.9  --  45.9  PLT 172  --  174  --  224  APTT 26  < > 53* 52* 49*  LABPROT 14.3  --  18.3*  --  19.6*  INR 1.10  --  1.50*  --  1.64*  CREATININE 1.08  --   --   --   --   < > = values in this interval not displayed.  Estimated Creatinine Clearance: 76.2 mL/min (by C-G formula based on Cr of 1.08).  Medications:  Infusions:  . sodium chloride    . bivalirudin (ANGIOMAX) infusion 0.5 mg/mL (Non-ACS indications) 0.2 mg/kg/hr (08/30/14 2012)    Assessment: [age] yoM admitted with alcohol withdrawal seizure found to be in afib with RVR and elevated troponins.  Bivalirudin started 4/4 for r/o ACS since patient has pork allergy and liver dysfunction.  Warfarin initiated 4/6 but anticoagulation was subsequently d/c'd  same day d/t concern of compliance and alcohol abuse. On 4/7 cardiology decided to pursue DCCV so recommend resume Bivalirudin and warfarin. ASA was stopped   APTT was previously therapeutic on bivalirudin infusing at 0.15 mg/kg/h  Today, 08/31/2014:   INR = 1.64  aPTT =  49 sec (SUBtherapeutic) on 0.2 mg/kg/hr. Just below therapeutic threshold but has been steadily trending down since 4/8.  I suspect previously therapeutic aPTT on 0.15 mg/kg/hr may be due to closer proximity to an acute intoxication event, acute liver injury which is now improving, and poor  nutrition which is also now being corrected.  CBC: Hgb WNL.  Platelets improved to WNL.  Previous Thrombocytopenia suspected d/t h/o ETOH abuse.    SCr WNL  No bleeding/complications reported.  Possible drug interactions with warfarin: steroids (can increase anticoagulant effect)  Goal of Therapy:  INR 2-3 aPTT 50-85 seconds Monitor platelets by anticoagulation protocol: Yes   Plan:   Increase bivalirudin rate by 10% to 0.22 mg/kg/hr (37.7 ml/hr).  I suspect aPTT may be becoming more resistant to anticoagulation for reasons listed above, but I don't think a 20% rate increase is necessary in this case.  Recheck aPTT in 2 hrs; will need 2 consecutive therapeutic readings before returning to daily aPTT checks  Daily INR and CBC.  Warfarin 5mg  PO x 1 again tonight - watch INR trend closely d/t liver dysfunction and EtOH abuse.  Suspect bivalirudin is falsely elevating INR (reason for more rapid rise in INR overnight) - previous INR values while on bivalirudin consistent with this then INR normalized (to 1.1) on 4/7 following d/c of bivalirudin 4/6.  (INR interference typically less with bivalirudin vs argatroban).  If INR trend slows, may be more aggressive with warfarin tomorrow.  TEE/DCCV planned for Monday, 4/11   Coumadin book/video provided 4/6  Provide warfarin education - included discussion re: limiting (1 drink/day)/avoiding alcohol with warfarin  Bernadene Personrew Corneluis [place]ston, PharmD Pager: [phone] 08/31/2014, 8:02 AM

## 2014-08-31 NOTE — Progress Notes (Signed)
Progress Note   Benjamin Blackwell JXB:[id number] DOB: 09-17-1950 DOA: 08/25/2014 PCP: No primary care provider on file.   Brief Narrative:   Benjamin Blackwell is an 64 y.o. male a PMH of alcohol dependency who was admitted 08/25/14 after suffering from an alcohol withdrawal seizure. On initial presentation, the patient was found to be in atrial fibrillation with RVR.  Plan is to proceed with cardioversion 09/01/14.  Will need 6 weeks of coumadin therapy.  He has completed detox therapy for his alcohol dependency.  Assessment/Plan:   Principal Problem:  Alcohol withdrawal seizure / Alcohol dependency - S/P detox with Ativan per CIWA protocol. Can discontinue seizure precautions. - The patient is stable with no further tremulousness or signs of withdrawal. - Supplementing thiamine, folic acid.  Active Problems:   Metabolic acidosis - Likely from elevated lactate levels from seizure activity. Resolved.   Atrial fibrillation/flutter with RVR / demand ischemia - Mild elevation of troponins and ST elevations in the inferior leads on  12-lead EKG, felt to be from demand ischemia.  - 2-D echo showed normal LA size, mild LVH with a normal EF. Slight elevation of BNP noted. - Heart rate controlled with Cardizem and metoprolol. Continue aspirin, Cardizem, metoprolol and bivalirudin. - Cardiology following. For cardioversion, which has been re-scheduled for 09/02/14. - Continue Coumadin. - On Lipitor per cardiology, OK to continue for now since LFTs improved.   Hypertension  - Continue Cardizem, clonidine, HCTZ and metoprolol.  Diastolic pressure still on the high side.   Hypokalemia  - Repleted. Magnesium okay.    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   - Discriminant function was 32, so steroids were started.  Continue steroids and PPI therapy. - LFTs improving. Would continue steroids at the current dose for 1 week then  taper.    Hyperglycemia/prediabetes - Hemoglobin A1c 5.8%. - Dietitian counseled patient on 08/28/14.   Thrombocytopenia - Likely from toxic effects of ETOH on bone marrow. - Resolved with avoidance of alcohol.   DVT prophylaxis - Anticoagulated with bivalirudin.  Code Status: Full. Family Communication: Daughter Danford Bad updated at bedside 08/26/14. Disposition Plan: Home when stable.   IV Access:    Peripheral IV   Procedures and diagnostic studies:   Ct Head Wo Contrast 08/25/2014: 1. 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 No significant mass effect to suggest acute or subacute process. 2.  Cerebral atrophy and small vessel ischemic change.   Dg Chest Port 1 View 08/25/2014: Moderate cardiomegaly with central vascular congestion but no focal acute finding.     2-D echo 08/27/14: Study Conclusions  - Left ventricle: The cavity size was normal. There was mild concentric hypertrophy. Systolic function was normal. The estimated ejection fraction was in the range of 50% to 55%. Wall motion was normal; there were no regional wall motion abnormalities. - Aortic valve: Trileaflet; mildly thickened, mildly calcified leaflets. - Mitral valve: There was trivial regurgitation  Medical Consultants:    Kathleene Hazel, MD, Cardiology  Anti-Infectives:    None.  Subjective:   [name] continues to deny chest pain, shortness of breath, nausea and vomiting.  Appetite remains good.  No tremors or DTs noted. Bowels moved this morning.  Objective:    Filed Vitals:   08/30/14 2255 08/31/14 0000 08/31/14 0244 08/31/14 0500  BP: 162/105 156/108 132/92 150/100  Pulse: 84 74 75 74  Temp:    97.8 F (36.6 C)  TempSrc:    Oral  Resp:    18  Height:      Weight:      SpO2:    100%    Intake/Output Summary (Last 24 hours) at 08/31/14 0752 Last data filed at 08/31/14 1610  Gross per 24 hour  Intake    240 ml   Output   2700 ml  Net  -2460 ml    Exam: Gen:  NAD Cardiovascular:  RRR Respiratory:  Lungs CTAB Gastrointestinal:  Abdomen soft, NT/ND, + BS Extremities:  No C/E/C   Data Reviewed:    Labs: Basic Metabolic Panel:  Recent Labs Lab 08/25/14 1510 08/26/14 0346 08/27/14 0330 08/28/14 0333 08/29/14 0446  NA 143 143 139 138 138  K 4.1 3.2* 3.6 3.9 3.6  CL 105 106 106 103 102  CO2 13* GLUCOSE 190* 121* 122* 134* 148*  BUN CREATININE 1.21 0.98 0.92 0.82 1.08  CALCIUM 9.3 9.1 8.8 9.2 9.3  MG  --  2.0  --   --   --    GFR Estimated Creatinine Clearance: 76.2 mL/min (by C-G formula based on Cr of 1.08). Liver Function Tests:  Recent Labs Lab 08/25/14 1853 08/27/14 0330 08/28/14 0333 08/29/14 0446  AST 280* 1338* 444* 203*  ALT 191* 773* 551* 381*  ALKPHOS 70 58 58 62  BILITOT 1.7* 1.6* 1.3* 0.8  PROT 9.0* 7.5 7.8 7.8  ALBUMIN 4.5 3.6 3.6 3.7   Coagulation profile  Recent Labs Lab 08/27/14 0330 08/28/14 0333 08/29/14 0446 08/30/14 0047 08/31/14 0548  INR 1.54* 1.48 1.10 1.50* 1.64*    CBC:  Recent Labs Lab 08/27/14 0330 08/28/14 0333 08/29/14 0446 08/30/14 0047 08/31/14 0548  WBC 5.2 4.8 6.0 5.8 8.0  HGB 14.0 14.5 14.8 15.0 16.1  HCT 41.0 41.1 42.2 41.9 45.9  MCV 98.1 95.8 95.5 95.2 95.6  PLT 110* 134* 172 174 224   Cardiac Enzymes:  Recent Labs Lab 08/25/14 1853 08/26/14 0100 08/26/14 0800  TROPONINI 0.07* 0.12* 0.10*   CBG:  Recent Labs Lab 08/25/14 1445  GLUCAP 178*    Microbiology Recent Results (from the past 240 hour(s))  MRSA PCR Screening     Status: None   Collection Time: 08/25/14  6:40 PM  Result Value Ref Range Status   MRSA by PCR NEGATIVE NEGATIVE Final    Comment:        The GeneXpert MRSA Assay (FDA approved for NASAL specimens only), is one component of a comprehensive MRSA colonization surveillance program. It is not intended to diagnose MRSA infection nor to guide  or monitor treatment for MRSA infections.      Medications:   . atorvastatin  10 mg Oral q1800  . cloNIDine  0.2 mg Oral BID  . FLUoxetine  20 mg Oral Daily  . folic acid  1 mg Oral Daily  . hydrochlorothiazide  25 mg Oral Daily  . metoprolol tartrate  50 mg Oral BID  . mirtazapine  7.5 mg Oral QHS  . multivitamin with minerals  1 tablet Oral Daily  . pantoprazole  40 mg Oral Q0600  . prednisoLONE  40 mg Oral Daily  . thiamine  100 mg Oral Daily   Or  . thiamine  100 mg Intravenous Daily  . Warfarin - Pharmacist Dosing Inpatient   Does not apply q1800   Continuous Infusions: . sodium chloride    . bivalirudin (ANGIOMAX) infusion 0.5 mg/mL (Non-ACS indications)  0.2 mg/kg/hr (08/30/14 2012)    Time spent: 25 minutes.    LOS: 6 days   RAMA,CHRISTINA  [facility] Pager [phone]. If unable to reach me by pager, please call my cell phone at [phone].  *Please refer to [email or web address], password [email or web address] to get updated schedule on who will round on this patient, as hospitalists switch teams weekly. If 7PM-7AM, please contact night-coverage at [email or web address], password [email or web address] for any overnight needs.  08/31/2014, 7:52 AM

## 2014-08-31 NOTE — Progress Notes (Signed)
Patient Name: Benjamin Blackwell Date of Encounter: 08/31/2014     Principal Problem:   Alcohol withdrawal seizure Active Problems:   Atrial fibrillation with RVR   Alcohol dependency   Hypertension   Hyperglycemia   Thrombocytopenia   Hypokalemia   Elevated LFTs   Metabolic acidosis   Alcoholic hepatitis   Prediabetes   SOB (shortness of breath)   Seizure    SUBJECTIVE  The patient was scheduled for TEE cardioversion yesterday but the procedure did not get done.  Apparently there was no room on the schedule.  We will plan for this to be done on Monday.  The patient feels well.  Telemetry shows atrial flutter with controlled ventricular response.  Patient denies chest pain or shortness of breath.  CURRENT MEDS . atorvastatin  10 mg Oral q1800  . cloNIDine  0.2 mg Oral BID  . FLUoxetine  20 mg Oral Daily  . folic acid  1 mg Oral Daily  . hydrochlorothiazide  25 mg Oral Daily  . metoprolol tartrate  50 mg Oral BID  . mirtazapine  7.5 mg Oral QHS  . multivitamin with minerals  1 tablet Oral Daily  . pantoprazole  40 mg Oral Q0600  . prednisoLONE  40 mg Oral Daily  . thiamine  100 mg Oral Daily   Or  . thiamine  100 mg Intravenous Daily  . warfarin  5 mg Oral ONCE-1800  . Warfarin - Pharmacist Dosing Inpatient   Does not apply q1800    OBJECTIVE  Filed Vitals:   08/30/14 2255 08/31/14 0000 08/31/14 0244 08/31/14 0500  BP: 162/105 156/108 132/92 150/100  Pulse: 84 74 75 74  Temp:    97.8 F (36.6 C)  TempSrc:    Oral  Resp:    18  Height:      Weight:      SpO2:    100%    Intake/Output Summary (Last 24 hours) at 08/31/14 0914 Last data filed at 08/31/14 0845  Gross per 24 hour  Intake 299.85 ml  Output   2300 ml  Net -2000.15 ml   Filed Weights   08/25/14 1851 08/27/14 0400 08/28/14 0400  Weight: 188 lb 7.9 oz (85.5 kg) 195 lb 12.3 oz (88.8 kg) 192 lb 10.9 oz (87.4 kg)    PHYSICAL EXAM  General: Pleasant, NAD. Neuro: Alert and oriented X 3. Moves  all extremities spontaneously. Psych: Normal affect. HEENT:  Normal  Neck: Supple without bruits or JVD. Lungs:  Resp regular and unlabored, CTA. Heart: RRR no s3, s4, or murmurs. Abdomen: Soft, non-tender, non-distended, BS + x 4.  Extremities: No clubbing, cyanosis or edema. DP/PT/Radials 2+ and equal bilaterally.  Accessory Clinical Findings  CBC  Recent Labs  08/30/14 0047 08/31/14 0548  WBC 5.8 8.0  HGB 15.0 16.1  HCT 41.9 45.9  MCV 95.2 95.6  PLT 174 224   Basic Metabolic Panel  Recent Labs  08/29/14 0446  NA 138  K 3.6  CL 102  CO2 25  GLUCOSE 148*  BUN 19  CREATININE 1.08  CALCIUM 9.3   Liver Function Tests  Recent Labs  08/29/14 0446  AST 203*  ALT 381*  ALKPHOS 62  BILITOT 0.8  PROT 7.8  ALBUMIN 3.7   No results for input(s): LIPASE, AMYLASE in the last 72 hours. Cardiac Enzymes No results for input(s): CKTOTAL, CKMB, CKMBINDEX, TROPONINI in the last 72 hours. BNP Invalid input(s): POCBNP D-Dimer No results for input(s): DDIMER in the last 72  hours. Hemoglobin A1C No results for input(s): HGBA1C in the last 72 hours. Fasting Lipid Panel No results for input(s): CHOL, HDL, LDLCALC, TRIG, CHOLHDL, LDLDIRECT in the last 72 hours. Thyroid Function Tests No results for input(s): TSH, T4TOTAL, T3FREE, THYROIDAB in the last 72 hours.  Invalid input(s): FREET3  TELE  Atrial flutter with controlled ventricular response  ECG    Radiology/Studies  Ct Head Wo Contrast  08/25/2014   CLINICAL DATA:  Ethanol abuse and seizure.  EXAM: CT HEAD WITHOUT CONTRAST  TECHNIQUE: Contiguous axial images were obtained from the base of the skull through the vertex without intravenous contrast.  COMPARISON:  None.  FINDINGS: Sinuses/Soft tissues: Clear paranasal sinuses and mastoid air cells.  Intracranial: Age advanced cerebral atrophy. Left occipital lobe hypoattenuation is most consistent with remote infarct. Mild low density in the periventricular white  matter likely related to small vessel disease. No mass lesion, hemorrhage, hydrocephalus, acute infarct, intra-axial, or extra-axial fluid collection.  IMPRESSION: 1. 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 No significant mass effect to suggest acute or subacute process. 2.  Cerebral atrophy and small vessel ischemic change.   Electronically Signed   By: Jeronimo GreavesKyle  Talbot M.D.   On: 08/25/2014 17:07   Dg Chest Port 1 View  08/25/2014   CLINICAL DATA:  Shortness of breath  EXAM: PORTABLE CHEST - 1 VIEW  COMPARISON:  None.  FINDINGS: The heart size is moderately enlarged without evidence for edema. Cardiac leads obscure detail. Both lungs are clear. The visualized skeletal structures are unremarkable.  IMPRESSION: Moderate cardiomegaly with central vascular congestion but no focal acute finding.   Electronically Signed   By: Christiana PellantGretchen  Green M.D.   On: 08/25/2014 15:48    ASSESSMENT AND PLAN  1. Continues with atrial flutter and variable ventricular response.  2. Alcohol abuse 3. Pork allergy requiring bivalirudin as anticoagulant. 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 Will need chronic anticoagulation for 6 weeks.  Warfarin.  Plan: Will reschedule TEE/cardioversion for Monday.  Will recheck labs tomorrow. Signed, Cassell Clementhomas Bowden Boody MD

## 2014-08-31 NOTE — Progress Notes (Addendum)
Pharmacy - Bivalirudin dosing  Assessment: 63yoM on bivalirudin/warfarin for new Afib.  Recently increased bivalirudin rate with aPTTs trending down to subtherapeutic  4/9: 1100 aPTT 52 on 0.22 mg/kg/hr 4/9: 1500 confirmatory aPTT 52 No line or bleeding issues per nursing  Plan: Continue bivalirudin IV at 0.22 mg/kg/hr.  May transition back to daily aPTTs  Bernadene Personrew Ziana Heyliger, PharmD Pager: [phone] 08/31/2014, 3:59 PM

## 2014-09-01 LAB — COMPREHENSIVE METABOLIC PANEL
ALK PHOS: 59 U/L (ref 39–117)
ALT: 240 U/L — ABNORMAL HIGH (ref 0–53)
AST: 98 U/L — ABNORMAL HIGH (ref 0–37)
Albumin: 3.3 g/dL — ABNORMAL LOW (ref 3.5–5.2)
Anion gap: 10 (ref 5–15)
BILIRUBIN TOTAL: 0.7 mg/dL (ref 0.3–1.2)
BUN: 21 mg/dL (ref 6–23)
CO2: 26 mmol/L (ref 19–32)
Calcium: 9.3 mg/dL (ref 8.4–10.5)
Chloride: 103 mmol/L (ref 96–112)
Creatinine, Ser: 1.06 mg/dL (ref 0.50–1.35)
GFR calc Af Amer: 85 mL/min — ABNORMAL LOW (ref >90)
GFR, EST NON AFRICAN AMERICAN: 73 mL/min — AB (ref >90)
Glucose, Bld: 141 mg/dL — ABNORMAL HIGH (ref 70–99)
POTASSIUM: 3.4 mmol/L — AB (ref 3.5–5.1)
SODIUM: 139 mmol/L (ref 135–145)
TOTAL PROTEIN: 7.7 g/dL (ref 6.0–8.3)

## 2014-09-01 LAB — CBC
HCT: 44 % (ref 39.0–52.0)
Hemoglobin: 15.5 g/dL (ref 13.0–17.0)
MCH: 33.7 pg (ref 26.0–34.0)
MCHC: 35.2 g/dL (ref 30.0–36.0)
MCV: 95.7 fL (ref 78.0–100.0)
PLATELETS: 211 10*3/uL (ref 150–400)
RBC: 4.6 MIL/uL (ref 4.22–5.81)
RDW: 12.9 % (ref 11.5–15.5)
WBC: 6.7 10*3/uL (ref 4.0–10.5)

## 2014-09-01 LAB — PROTIME-INR
INR: 1.91 — ABNORMAL HIGH (ref 0.00–1.49)
PROTHROMBIN TIME: 22 s — AB (ref 11.6–15.2)

## 2014-09-01 LAB — APTT: APTT: 56 s — AB (ref 24–37)

## 2014-09-01 MED ORDER — POTASSIUM CHLORIDE CRYS ER 20 MEQ PO TBCR
40.0000 meq | EXTENDED_RELEASE_TABLET | Freq: Once | ORAL | Status: AC
  Administered 2014-09-01: 40 meq via ORAL
  Filled 2014-09-01: qty 2

## 2014-09-01 MED ORDER — PREDNISOLONE 5 MG PO TABS
30.0000 mg | ORAL_TABLET | Freq: Every day | ORAL | Status: DC
  Administered 2014-09-02: 30 mg via ORAL
  Filled 2014-09-01 (×2): qty 6

## 2014-09-01 MED ORDER — WARFARIN SODIUM 5 MG PO TABS
5.0000 mg | ORAL_TABLET | Freq: Once | ORAL | Status: AC
  Administered 2014-09-01: 5 mg via ORAL
  Filled 2014-09-01: qty 1

## 2014-09-01 MED ORDER — POTASSIUM CHLORIDE CRYS ER 20 MEQ PO TBCR
20.0000 meq | EXTENDED_RELEASE_TABLET | Freq: Two times a day (BID) | ORAL | Status: DC
  Administered 2014-09-01 – 2014-09-08 (×15): 20 meq via ORAL
  Filled 2014-09-01 (×15): qty 1

## 2014-09-01 NOTE — Progress Notes (Signed)
Progress Note   Benjamin Blackwell ZOX:[id number] DOB: [birth year] DOA: 08/25/2014 PCP: No primary Blackwell provider on file.   Brief Narrative:   Benjamin Blackwell is an 64 y.o. male a PMH of alcohol dependency who was admitted 08/25/14 after suffering from an alcohol withdrawal seizure. On initial presentation, the patient was found to be in atrial fibrillation with RVR.  Plan is to proceed with cardioversion 09/02/14.  Will need 6 weeks of coumadin therapy.  He has completed detox therapy for his alcohol dependency.  Assessment/Plan:   Principal Problem:  Alcohol withdrawal seizure / Alcohol dependency - S/P detox with Ativan per CIWA protocol.  - The patient is stable with no further tremulousness or signs of withdrawal. - Supplementing thiamine, folic acid.  Active Problems:    Hypokalemia -  Replete.    Metabolic acidosis - Likely from elevated lactate levels from seizure activity. Resolved.   Atrial fibrillation/flutter with RVR / demand ischemia - Mild elevation of troponins and ST elevations in the inferior leads on  12-lead EKG, felt to be from demand ischemia.  - 2-D echo showed normal LA size, mild LVH with a normal EF. Slight elevation of BNP noted. - Heart rate controlled with Cardizem and metoprolol. Continue aspirin, Cardizem, metoprolol and bivalirudin. - Cardiology following. For cardioversion, which has been re-scheduled for 09/02/14. - Continue Coumadin Benjamin Blackwell. - On Lipitor per cardiology, OK to continue for now since LFTs continue to improve.   Hypertension  - Continue Cardizem, clonidine, HCTZ and metoprolol.  Diastolic pressure still on the high side: 88-110.   Hypokalemia  - Repleted. Magnesium okay.    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   - Discriminant function was 32, so steroids were started.  Continue steroids and PPI therapy. - LFTs improving. Would begin to taper steroids 09/02/14,  status post one week of  prednisolone at 40 mg daily.    Hyperglycemia/prediabetes - Hemoglobin A1c 5.8%. - Dietitian counseled patient on 08/28/14.   Thrombocytopenia - Likely from toxic effects of ETOH on bone marrow. - Resolved with avoidance of alcohol.   DVT prophylaxis - Anticoagulated with bivalirudin.  Code Status: Full. Family Communication: Daughter Benjamin Blackwell updated at bedside 08/26/14. Disposition Plan: Home when stable.   IV Access:    Peripheral IV   Procedures and diagnostic studies:   Ct Head Wo Contrast 08/25/2014: 1. 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 No significant mass effect to suggest acute or subacute process. 2.  Cerebral atrophy and small vessel ischemic change.   Dg Chest Port 1 View 08/25/2014: Moderate cardiomegaly with central vascular congestion but no focal acute finding.     2-D echo 08/27/14: Study Conclusions  - Left ventricle: The cavity size was normal. There was mild concentric hypertrophy. Systolic function was normal. The estimated ejection fraction was in the range of 50% to 55%. Wall motion was normal; there were no regional wall motion abnormalities. - Aortic valve: Trileaflet; mildly thickened, mildly calcified leaflets. - Mitral valve: There was trivial regurgitation  Medical Consultants:    Benjamin Hazel, MD, Cardiology  Anti-Infectives:    None.  Subjective:   Benjamin Blackwell continues to deny chest pain, shortness of breath, nausea and vomiting.  Appetite remains good.  No tremors or DTs noted. Bowels are moving normally.  Objective:    Filed Vitals:   08/31/14 2119 09/01/14 0508 09/01/14 0515 09/01/14 0615  BP: 113/88 166/122 170/110 120/90  Pulse:  76 67    Temp: 98.5 F (36.9 C) 98 F (36.7 C)    TempSrc: Oral Oral    Resp: 18 18    Height:      Weight:      SpO2: 99% 99%      Intake/Output Summary (Last 24 hours) at 09/01/14 0906 Last data  filed at 09/01/14 0645  Gross per 24 hour  Intake  385.4 ml  Output   1200 ml  Net -814.6 ml    Exam: Gen:  NAD Cardiovascular:  RRR Respiratory:  Lungs CTAB Gastrointestinal:  Abdomen soft, NT/ND, + BS Extremities:  No C/E/C   Data Reviewed:    Labs: Basic Metabolic Panel:  Recent Labs Lab 08/26/14 0346 08/27/14 0330 08/28/14 0333 08/29/14 0446 09/01/14 0455  NA 143 139 138 138 139  K 3.2* 3.6 3.9 3.6 3.4*  CL 106 106 103 102 103  CO2 24 25 24 25 26   GLUCOSE 121* 122* 134* 148* 141*  BUN 7 7 11 19 21   CREATININE 0.98 0.92 0.82 1.08 1.06  CALCIUM 9.1 8.8 9.2 9.3 9.3  MG 2.0  --   --   --   --    GFR Estimated Creatinine Clearance: 77.7 mL/min (by C-G formula based on Cr of 1.06). Liver Function Tests:  Recent Labs Lab 08/25/14 1853 08/27/14 0330 08/28/14 0333 08/29/14 0446 09/01/14 0455  AST 280* 1338* 444* 203* 98*  ALT 191* 773* 551* 381* 240*  ALKPHOS 70 58 58 62 59  BILITOT 1.7* 1.6* 1.3* 0.8 0.7  PROT 9.0* 7.5 7.8 7.8 7.7  ALBUMIN 4.5 3.6 3.6 3.7 3.3*   Coagulation profile  Recent Labs Lab 08/28/14 0333 08/29/14 0446 08/30/14 0047 08/31/14 0548 09/01/14 0455  INR 1.48 1.10 1.50* 1.64* 1.91*    CBC:  Recent Labs Lab 08/28/14 0333 08/29/14 0446 08/30/14 0047 08/31/14 0548 09/01/14 0455  WBC 4.8 6.0 5.8 8.0 6.7  HGB 14.5 14.8 15.0 16.1 15.5  HCT 41.1 42.2 41.9 45.9 44.0  MCV 95.8 95.5 95.2 95.6 95.7  PLT 134* 172 174 224 211   Cardiac Enzymes:  Recent Labs Lab 08/25/14 1853 08/26/14 0100 08/26/14 0800  TROPONINI 0.07* 0.12* 0.10*   CBG:  Recent Labs Lab 08/25/14 1445  GLUCAP 178*    Microbiology Recent Results (from the past 240 hour(s))  MRSA PCR Screening     Status: None   Collection Time: 08/25/14  6:40 PM  Result Value Ref Range Status   MRSA by PCR NEGATIVE NEGATIVE Final    Comment:        The GeneXpert MRSA Assay (FDA approved for NASAL specimens only), is one component of a comprehensive MRSA  colonization surveillance program. It is not intended to diagnose MRSA infection nor to guide or monitor treatment for MRSA infections.      Medications:   . atorvastatin  10 mg Oral q1800  . cloNIDine  0.2 mg Oral BID  . FLUoxetine  20 mg Oral Daily  . folic acid  1 mg Oral Daily  . hydrochlorothiazide  25 mg Oral Daily  . metoprolol tartrate  50 mg Oral BID  . mirtazapine  7.5 mg Oral QHS  . multivitamin with minerals  1 tablet Oral Daily  . pantoprazole  40 mg Oral Q0600  . potassium chloride  20 mEq Oral BID  . prednisoLONE  40 mg Oral Daily  . thiamine  100 mg Oral Daily   Or  . thiamine  100 mg Intravenous Daily  .  warfarin  5 mg Oral ONCE-1800  . Warfarin - Pharmacist Dosing Inpatient   Does not apply q1800   Continuous Infusions: . sodium chloride    . bivalirudin (ANGIOMAX) infusion 0.5 mg/mL (Non-ACS indications) 0.22 mg/kg/hr (09/01/14 0215)    Time spent: 25 minutes.    LOS: 7 days   Benjamin Blackwell  [facility] Pager [phone]. If unable to reach me by pager, please call my cell phone at [phone].  *Please refer to [email or web address], password [email or web address] to get updated schedule on who will round on this patient, as hospitalists switch teams weekly. If 7PM-7AM, please contact night-coverage at [email or web address], password [email or web address] for any overnight needs.  09/01/2014, 9:06 AM

## 2014-09-01 NOTE — Progress Notes (Addendum)
ANTICOAGULATION CONSULT NOTE - Follow Up Consult  Pharmacy Consult for Bivalirudin/warfarin (resumed 4/7) Indication: Atrial fibrillation  Allergies  Allergen Reactions  . Beef-Derived Products Hives  . Pork-Derived Products Hives    Patient Measurements: Height: 5\' 8"  (172.7 cm) Weight: 192 lb 10.9 oz (87.4 kg) IBW/kg (Calculated) : 68.4  Vital Signs: Temp: 98 F (36.7 C) (04/10 0508) Temp Source: Oral (04/10 0508) BP: 120/90 mmHg (04/10 0615) Pulse Rate: 67 (04/10 0508)  Labs:  Recent Labs  08/30/14 0047  08/31/14 0548 08/31/14 1106 08/31/14 1515 09/01/14 0455  HGB 15.0  --  16.1  --   --  15.5  HCT 41.9  --  45.9  --   --  44.0  PLT 174  --  224  --   --  211  APTT 53*  < > 49* 52* 52* 56*  LABPROT 18.3*  --  19.6*  --   --  22.0*  INR 1.50*  --  1.64*  --   --  1.91*  CREATININE  --   --   --   --   --  1.06  < > = values in this interval not displayed.  Estimated Creatinine Clearance: 77.7 mL/min (by C-G formula based on Cr of 1.06).  Medications:  Infusions:  . sodium chloride    . bivalirudin (ANGIOMAX) infusion 0.5 mg/mL (Non-ACS indications) 0.22 mg/kg/hr (09/01/14 0215)    Assessment: [age] yoM admitted with alcohol withdrawal seizure found to be in afib with RVR and elevated troponins.  Bivalirudin started 4/4 for r/o ACS since patient has pork allergy and liver dysfunction.  Warfarin initiated 4/6 but anticoagulation was subsequently d/c'd  same day d/t concern of compliance and alcohol abuse. On 4/7 cardiology decided to pursue DCCV so recommend resume Bivalirudin and warfarin. ASA was stopped  Today, 09/01/2014:   INR = subtherapeutic but trending up appropriately  aPTT = therapeutic  CBC wnl.  Previous Thrombocytopenia suspected d/t h/o ETOH abuse.  SCr wnl  No bleeding/complications reported.  Possible drug interactions with warfarin: steroids (can increase anticoagulant effect)  Eating 100% of meals  Goal of Therapy:  INR 2-3 aPTT  50-85 seconds Monitor platelets by anticoagulation protocol: Yes   Plan:  Day 4 warfarin bridged with bivalirudin  Continue bivalirudin at 0.22 mg/kg/hr (37.7 ml/hr).    Daily INR/aPTT, CBC.  Warfarin 5mg  PO x 1 again tonight - watch INR trend closely d/t liver dysfunction and EtOH abuse.  Guidelines suggest continuing bivalirudin for at least 5 days AND until INR stable at > 2.5 (INR interference typically less with bivalirudin vs argatroban).  TEE/DCCV planned for Monday, 4/11   Patient has received warfarin education   Bernadene Personrew Mackinze Criado, PharmD Pager: [phone] 09/01/2014, 7:33 AM

## 2014-09-01 NOTE — Progress Notes (Signed)
Patient Name: Benjamin Blackwell Date of Encounter: 09/01/2014     Principal Problem:   Alcohol withdrawal seizure Active Problems:   Atrial fibrillation with RVR   Alcohol dependency   Hypertension   Hyperglycemia   Thrombocytopenia   Hypokalemia   Elevated LFTs   Metabolic acidosis   Alcoholic hepatitis   Prediabetes   SOB (shortness of breath)   Seizure    SUBJECTIVE  No chest pain or shortness of breath.  Rhythm remains atrial flutter with controlled ventricular response.  He is awaiting TEE cardioversion tomorrow  CURRENT MEDS . atorvastatin  10 mg Oral q1800  . cloNIDine  0.2 mg Oral BID  . FLUoxetine  20 mg Oral Daily  . folic acid  1 mg Oral Daily  . hydrochlorothiazide  25 mg Oral Daily  . metoprolol tartrate  50 mg Oral BID  . mirtazapine  7.5 mg Oral QHS  . multivitamin with minerals  1 tablet Oral Daily  . pantoprazole  40 mg Oral Q0600  . potassium chloride  20 mEq Oral BID  . prednisoLONE  40 mg Oral Daily  . thiamine  100 mg Oral Daily   Or  . thiamine  100 mg Intravenous Daily  . warfarin  5 mg Oral ONCE-1800  . Warfarin - Pharmacist Dosing Inpatient   Does not apply q1800    OBJECTIVE  Filed Vitals:   08/31/14 2119 09/01/14 0508 09/01/14 0515 09/01/14 0615  BP: 113/88 166/122 170/110 120/90  Pulse: 76 67    Temp: 98.5 F (36.9 C) 98 F (36.7 C)    TempSrc: Oral Oral    Resp: 18 18    Height:      Weight:      SpO2: 99% 99%      Intake/Output Summary (Last 24 hours) at 09/01/14 0850 Last data filed at 09/01/14 0645  Gross per 24 hour  Intake  385.4 ml  Output   1200 ml  Net -814.6 ml   Filed Weights   08/25/14 1851 08/27/14 0400 08/28/14 0400  Weight: 188 lb 7.9 oz (85.5 kg) 195 lb 12.3 oz (88.8 kg) 192 lb 10.9 oz (87.4 kg)    PHYSICAL EXAM  General: Pleasant, NAD. Neuro: Alert and oriented X 3. Moves all extremities spontaneously. Psych: Normal affect. HEENT:  Normal  Neck: Supple without bruits or JVD. Lungs:  Resp  regular and unlabored, CTA. Heart: Irregularly irregular no s3, s4, or murmurs. Abdomen: Soft, non-tender, non-distended, BS + x 4.  Extremities: No clubbing, cyanosis or edema. DP/PT/Radials 2+ and equal bilaterally.  Accessory Clinical Findings  CBC  Recent Labs  08/31/14 0548 09/01/14 0455  WBC 8.0 6.7  HGB 16.1 15.5  HCT 45.9 44.0  MCV 95.6 95.7  PLT 224 211   Basic Metabolic Panel  Recent Labs  09/01/14 0455  NA 139  K 3.4*  CL 103  CO2 26  GLUCOSE 141*  BUN 21  CREATININE 1.06  CALCIUM 9.3   Liver Function Tests  Recent Labs  09/01/14 0455  AST 98*  ALT 240*  ALKPHOS 59  BILITOT 0.7  PROT 7.7  ALBUMIN 3.3*   No results for input(s): LIPASE, AMYLASE in the last 72 hours. Cardiac Enzymes No results for input(s): CKTOTAL, CKMB, CKMBINDEX, TROPONINI in the last 72 hours. BNP Invalid input(s): POCBNP D-Dimer No results for input(s): DDIMER in the last 72 hours. Hemoglobin A1C No results for input(s): HGBA1C in the last 72 hours. Fasting Lipid Panel No results for input(s): CHOL,  HDL, LDLCALC, TRIG, CHOLHDL, LDLDIRECT in the last 72 hours. Thyroid Function Tests No results for input(s): TSH, T4TOTAL, T3FREE, THYROIDAB in the last 72 hours.  Invalid input(s): FREET3  TELE  Atrial flutter with controlled ventricular response  ECG    Radiology/Studies  Ct Head Wo Contrast  08/25/2014   CLINICAL DATA:  Ethanol abuse and seizure.  EXAM: CT HEAD WITHOUT CONTRAST  TECHNIQUE: Contiguous axial images were obtained from the base of the skull through the vertex without intravenous contrast.  COMPARISON:  None.  FINDINGS: Sinuses/Soft tissues: Clear paranasal sinuses and mastoid air cells.  Intracranial: Age advanced cerebral atrophy. Left occipital lobe hypoattenuation is most consistent with remote infarct. Mild low density in the periventricular white matter likely related to small vessel disease. No mass lesion, hemorrhage, hydrocephalus, acute infarct,  intra-axial, or extra-axial fluid collection.  IMPRESSION: 1. 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 No significant mass effect to suggest acute or subacute process. 2.  Cerebral atrophy and small vessel ischemic change.   Electronically Signed   By: Jeronimo Greaves M.D.   On: 08/25/2014 17:07   Dg Chest Port 1 View  08/25/2014   CLINICAL DATA:  Shortness of breath  EXAM: PORTABLE CHEST - 1 VIEW  COMPARISON:  None.  FINDINGS: The heart size is moderately enlarged without evidence for edema. Cardiac leads obscure detail. Both lungs are clear. The visualized skeletal structures are unremarkable.  IMPRESSION: Moderate cardiomegaly with central vascular congestion but no focal acute finding.   Electronically Signed   By: Christiana Pellant M.D.   On: 08/25/2014 15:48    ASSESSMENT AND PLAN  1.  Paroxysmal atrial flutter with controlled variable ventricular response.  2. Alcohol abuse 3. Pork allergy requiring bivalirudin as anticoagulant. 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 Will need chronic anticoagulation for 6 weeks. Warfarin. 5.  Abnormal LFTs improving off alcohol 6.  Mild hypokalemia.  Will replete.   Plan: TEE/cardioversion for Monday. Recheck BMET in a.m.  Spoke to him about the importance of avoiding alcohol since he will be going home on warfarin.  Signed, Cassell Clement MD

## 2014-09-02 ENCOUNTER — Inpatient Hospital Stay (HOSPITAL_COMMUNITY): Payer: Self-pay | Admitting: Anesthesiology

## 2014-09-02 ENCOUNTER — Encounter (HOSPITAL_COMMUNITY)
Admission: EM | Disposition: A | Discharge status: Home Health | Payer: Self-pay | Source: Home / Self Care | Attending: Internal Medicine

## 2014-09-02 ENCOUNTER — Inpatient Hospital Stay (HOSPITAL_COMMUNITY): Payer: MEDICAID | Admitting: Anesthesiology

## 2014-09-02 ENCOUNTER — Encounter (HOSPITAL_COMMUNITY): Payer: Self-pay | Admitting: *Deleted

## 2014-09-02 DIAGNOSIS — I4892 Unspecified atrial flutter: Secondary | ICD-10-CM

## 2014-09-02 DIAGNOSIS — I340 Nonrheumatic mitral (valve) insufficiency: Secondary | ICD-10-CM

## 2014-09-02 HISTORY — PX: CARDIOVERSION: SHX1299

## 2014-09-02 HISTORY — PX: TEE WITHOUT CARDIOVERSION: SHX5443

## 2014-09-02 LAB — BASIC METABOLIC PANEL
Anion gap: 9 (ref 5–15)
BUN: 21 mg/dL (ref 6–23)
CHLORIDE: 106 mmol/L (ref 96–112)
CO2: 26 mmol/L (ref 19–32)
Calcium: 9.3 mg/dL (ref 8.4–10.5)
Creatinine, Ser: 1.16 mg/dL (ref 0.50–1.35)
GFR calc Af Amer: 76 mL/min — ABNORMAL LOW (ref >90)
GFR, EST NON AFRICAN AMERICAN: 66 mL/min — AB (ref >90)
Glucose, Bld: 144 mg/dL — ABNORMAL HIGH (ref 70–99)
Potassium: 3.8 mmol/L (ref 3.5–5.1)
Sodium: 141 mmol/L (ref 135–145)

## 2014-09-02 LAB — PROTIME-INR
INR: 1.97 — AB (ref 0.00–1.49)
Prothrombin Time: 22.6 seconds — ABNORMAL HIGH (ref 11.6–15.2)

## 2014-09-02 LAB — APTT: APTT: 55 s — AB (ref 24–37)

## 2014-09-02 SURGERY — ECHOCARDIOGRAM, TRANSESOPHAGEAL
Anesthesia: Monitor Anesthesia Care

## 2014-09-02 MED ORDER — WARFARIN SODIUM 6 MG PO TABS
6.0000 mg | ORAL_TABLET | Freq: Once | ORAL | Status: AC
Start: 2014-09-02 — End: 2014-09-02
  Administered 2014-09-02: 6 mg via ORAL
  Filled 2014-09-02: qty 1

## 2014-09-02 MED ORDER — LIDOCAINE HCL (CARDIAC) 20 MG/ML IV SOLN
INTRAVENOUS | Status: DC | PRN
  Administered 2014-09-02: 50 mg via INTRAVENOUS

## 2014-09-02 MED ORDER — PROPOFOL 10 MG/ML IV BOLUS
INTRAVENOUS | Status: DC | PRN
  Administered 2014-09-02 (×3): 30 mg via INTRAVENOUS
  Administered 2014-09-02: 50 mg via INTRAVENOUS
  Administered 2014-09-02 (×2): 30 mg via INTRAVENOUS

## 2014-09-02 MED ORDER — BUTAMBEN-TETRACAINE-BENZOCAINE 2-2-14 % EX AERO
INHALATION_SPRAY | CUTANEOUS | Status: DC | PRN
  Administered 2014-09-02: 2 via TOPICAL

## 2014-09-02 NOTE — Anesthesia Postprocedure Evaluation (Signed)
Anesthesia Post Note  Patient: Benjamin Blackwell  Procedure(s) Performed: Procedure(s) (LRB): TRANSESOPHAGEAL ECHOCARDIOGRAM (TEE) (N/A) CARDIOVERSION (N/A)  Anesthesia type: MAC  Patient location: PACU  Post pain: Pain level controlled  Post assessment: Patient's Cardiovascular Status Stable  Last Vitals:  Filed Vitals:   09/02/14 1432  BP:   Pulse: 63  Temp:   Resp: 33    Post vital signs: Reviewed and stable  Level of consciousness: sedated  Complications: No apparent anesthesia complications

## 2014-09-02 NOTE — Progress Notes (Signed)
Patient ID: Benjamin Blackwell, male   DOB: 07/02/1950, 64 y.o.   MRN: [id number] [facility] PROGRESS NOTE  Demarr Kluever NWG:[id number] DOB: 1951-02-15 DOA: 08/25/2014 PCP: No primary care provider on file.  Brief narrative:    64 y.o. male with past medical history of alcohol abuse and alcohol dependence who presented to [facility] because of alcohol related withdrawal seizures.   Hospital course complicated with finding of atrial fibrillation with RVR. Cardiology has seen the patient in consultation and recommends TEE cardioversion 09/02/2014 with requirement to continue 6 weeks of Coumadin therapy. Of note, he has completed detox therapy for alcohol dependency.   Assessment/Plan:    Principal Problem: Alcohol related withdrawal seizures / history of alcohol abuse and alcohol dependence - On CIWA protocol on admission. Has completed detox. - No reports of further withdrawal. - Continue folic acid, multivitamin and thiamine.  Active Problems:  Hypokalemia - Likely electrolyte depletion from alcohol abuse as well as diuretic (patient on HCTZ for blood pressure control).  - Potassium being supplemented, 20 mEq twice daily by mouth. - Potassium is within normal limits this morning.   Metabolic acidosis - Secondary to alcohol abuse. Resolved.   Atrial fibrillation/flutter with RVR / ST elevations in inferior leads / Troponin elevation -  ST elevation is noted in inferior leads on 12-lead EKG in addition to mild elevation in troponin levels. This is felt to be secondary to demand ischemia from atrial fibrillation with RVR. - Patient had 2-D echo done on this admission with normal ejection fraction. - Cardiology has seen the patient in consultation. - In regards to atrial fibrillation heart rate is controlled with metoprolol 50 mg twice daily. Plan is for TEE cardioversion today. - Continue anticoagulation with Coumadin.   Essential hypertension - Continue  clonidine 0.2 mg twice daily, HCTZ 25 mg daily, metoprolol 50 mg twice daily.    Elevated LFTs secondary to alcoholic hepatitis  - Abnormal LFTs related to alcohol-induced hepatitis. - LFTs are improving with steroids. We will continue to taper down, currently patient is on prednisolone 30 mg daily. Will taper by 10 mg a day. - While patient is on steroids continue PPI therapy.    Dyslipidemia - Patient is a Lipitor 10 mg daily. - Liver function enzymes are trending down.   Hyperglycemia/prediabetes - Hemoglobin A1c 5.8%. - Counseled on diet.    Depression - Continue Prozac 20 mg daily. Stable   Thrombocytopenia - Likely from bone marrow suppression from alcohol abuse. Platelet count is within normal limits.   DVT Prophylaxis  - On full dose anticoagulation with Coumadin.   Code Status: Full.  Family Communication:  plan of care discussed with the patient; his family not at the bedside  Disposition Plan: TEE today, will follow-up with cardiology about discharge planning. Hopefully discharge in next 24 hours.  IV access:  Peripheral IV  Procedures and diagnostic studies:    Ct Head Wo Contrast 08/25/2014: 1. 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 No significant mass effect to suggest acute or subacute process. 2. Cerebral atrophy and small vessel ischemic change.   Dg Chest Port 1 View 08/25/2014: Moderate cardiomegaly with central vascular congestion but no focal acute finding.   2-D echo 08/27/14:  Medical Consultants:  Cardiology  Other Consultants:  None   IAnti-Infectives:   None    [name], MD  [facility] Pager [phone]  If 7PM-7AM, please contact night-coverage [email or web address] Password [email or web address] 09/02/2014, 10:08 AM   LOS: 8  days    HPI/Subjective: No acute overnight events. Patient says he feels tired this am.  Objective: Filed Vitals:   09/02/14 0500 09/02/14 0617 09/02/14 0920 09/02/14 0938   BP: 163/100 150/90 158/112 158/112  Pulse: 68   68  Temp: 98 F (36.7 C)     TempSrc: Oral     Resp: 18     Height:      Weight:      SpO2: 100%       Intake/Output Summary (Last 24 hours) at 09/02/14 1008 Last data filed at 09/02/14 0900  Gross per 24 hour  Intake    360 ml  Output   1525 ml  Net  -1165 ml    Exam:   General:  Pt is alert, follows commands appropriately, not in acute distress  Cardiovascular: Rate controlled, S1/S2, no murmurs  Respiratory: Clear to auscultation bilaterally, no wheezing, no crackles, no rhonchi  Abdomen: Soft, non tender, non distended, bowel sounds present  Extremities: No edema, pulses DP and PT palpable bilaterally  Neuro: Grossly nonfocal  Data Reviewed: Basic Metabolic Panel:  Recent Labs Lab 08/27/14 0330 08/28/14 0333 08/29/14 0446 09/01/14 0455 09/02/14 0445  NA 139 138 138 139 141  K 3.6 3.9 3.6 3.4* 3.8  CL 106 103 102 103 106  CO2 25 24 25 26 26   GLUCOSE 122* 134* 148* 141* 144*  BUN 7 11 19 21 21   CREATININE 0.92 0.82 1.08 1.06 1.16  CALCIUM 8.8 9.2 9.3 9.3 9.3   Liver Function Tests:  Recent Labs Lab 08/27/14 0330 08/28/14 0333 08/29/14 0446 09/01/14 0455  AST 1338* 444* 203* 98*  ALT 773* 551* 381* 240*  ALKPHOS 58 58 62 59  BILITOT 1.6* 1.3* 0.8 0.7  PROT 7.5 7.8 7.8 7.7  ALBUMIN 3.6 3.6 3.7 3.3*   No results for input(s): LIPASE, AMYLASE in the last 168 hours. No results for input(s): AMMONIA in the last 168 hours. CBC:  Recent Labs Lab 08/28/14 0333 08/29/14 0446 08/30/14 0047 08/31/14 0548 09/01/14 0455  WBC 4.8 6.0 5.8 8.0 6.7  HGB 14.5 14.8 15.0 16.1 15.5  HCT 41.1 42.2 41.9 45.9 44.0  MCV 95.8 95.5 95.2 95.6 95.7  PLT 134* 172 174 224 211   Cardiac Enzymes: No results for input(s): CKTOTAL, CKMB, CKMBINDEX, TROPONINI in the last 168 hours. BNP: Invalid input(s): POCBNP CBG: No results for input(s): GLUCAP in the last 168 hours.  Recent Results (from the past 240  hour(s))  MRSA PCR Screening     Status: None   Collection Time: 08/25/14  6:40 PM  Result Value Ref Range Status   MRSA by PCR NEGATIVE NEGATIVE Final     Scheduled Meds: . atorvastatin  10 mg Oral q1800  . cloNIDine  0.2 mg Oral BID  . FLUoxetine  20 mg Oral Daily  . folic acid  1 mg Oral Daily  . hydrochlorothiazide  25 mg Oral Daily  . metoprolol tartrate  50 mg Oral BID  . mirtazapine  7.5 mg Oral QHS  . multivitamin with minerals  1 tablet Oral Daily  . pantoprazole  40 mg Oral Q0600  . potassium chloride  20 mEq Oral BID  . prednisoLONE  30 mg Oral Daily  . thiamine  100 mg Oral Daily   Or  . thiamine  100 mg Intravenous Daily  . warfarin  6 mg Oral ONCE-1800   Continuous Infusions: . sodium chloride    . bivalirudin (ANGIOMAX) infusion 0.5 mg/mL (Non-ACS  indications) 0.22 mg/kg/hr (09/02/14 0618)

## 2014-09-02 NOTE — Consult Note (Signed)
Psychiatry Consult follow-up  Reason for Consult:  Alcohol withdrawal with seizure, alcoholic hepatitis Referring Physician:  Dr. Rockne Menghini Patient Identification: Benjamin Blackwell MRN:  [id number] Principal Diagnosis: Alcohol withdrawal seizure Diagnosis:   Patient Active Problem List   Diagnosis Date Noted  . Atrial flutter [I48.92] 09/02/2014  . Seizure [R56.9]   . Prediabetes [R73.09] 08/28/2014  . SOB (shortness of breath) [R06.02]   . Alcoholic hepatitis [C58.85] 08/27/2014  . Hypokalemia [E87.6] 08/26/2014  . Elevated LFTs [R79.89] 08/26/2014  . Metabolic acidosis [O27.7] 08/26/2014  . Atrial fibrillation with RVR [I48.91] 08/25/2014  . Alcohol withdrawal seizure [F10.239] 08/25/2014  . Alcohol dependency [F10.20] 08/25/2014  . Hypertension [I10] 08/25/2014  . Hyperglycemia [R73.9] 08/25/2014  . Thrombocytopenia [D69.6] 08/25/2014    Total Time spent with patient: 30 minutes  Subjective:   Benjamin Blackwell is a 64 y.o. male patient admitted with status post alcohol withdrawal seizures.  HPI:  Benjamin Blackwell is an 64 y.o. male seen, chart reviewed and case discussed with the Dr. Rockne Menghini and psychiatric social service. Patient reported he has been depressed secondary to losing his mother and father when he was young. Patient reportedly dropped out of school and started working as a teenager. Patient married and has a child who is 48 years old. Patient reportedly never received medication management for depression or counseling. Patient denies current symptoms of suicidal, homicidal ideation, intention or plans. Patient reportedly drinking mostly 3 beer before going to the work and 6 pack after work since he was 64 years old. Patient has a previous of sober one time about 7 days and other time about 6 months. Patient was involved with DWI twice and was forced 4 substance abuse classes in the past. 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 Patient reportedly visiting [place] to attend a nephew wedding and then filed down with the alcohol withdrawal seizure. Patient reported he has a past history of alcohol withdrawal seizures 2. Patient stated he promised his wife is going to quit for good now and willing to receive substance abuse rehabilitation treatment upon discharge from the hospital in his county. Patient reported his wife is supportive to him. Patient is willing to try antidepressant medication for depression and insomnia.   09/02/2014  Interval history: Patient has been stable on his current symptoms of depression, anxiety and has been free from alcohol withdrawal symptoms. Patient reported he has postponed cardiac procedure from this weekend to today and waiting to complete the procedure. Patient sister has been at work and states it is okay to talk to her if needed. Patient has been taking medication as prescribed, tolerated well and has no reported side effects. Psychiatric social service following with the patient regarding outpatient substance abuse and mental health resources. Patient has no safety concerns, and denied current suicidal/homicidal ideation, intention or plans. Patient has no evidence of psychotic symptoms.   Past Medical History:  Past Medical History  Diagnosis Date  . Hypertension   . ETOH abuse   . Seizures     ETOH related  . A-fib     Past Surgical History  Procedure Laterality Date  . Shoulder surgery      left shoulder   Family History:  Family History  Problem Relation Age of Onset  . Heart attack Mother   . Heart attack Father   . Heart attack  Brother   . Heart attack Brother   . Diabetes Father   . Diabetes Brother   . Diabetes Sister    Social History:  History  Alcohol Use  . 0.0 oz/week  . 0 Standard drinks or equivalent per week    Comment: Daily, 12 pack a day, sometimes more, occasional  liquor     History  Drug Use  . Yes    Comment: Marijuana    History   Social History  . Marital Status: Divorced    Spouse Name: N/A  . Number of Children: 1  . Years of Education: N/A   Occupational History  . Laborer    Social History Main Topics  . Smoking status: Never Smoker   . Smokeless tobacco: Not on file  . Alcohol Use: 0.0 oz/week    0 Standard drinks or equivalent per week     Comment: Daily, 12 pack a day, sometimes more, occasional liquor  . Drug Use: Yes     Comment: Marijuana  . Sexual Activity: Not on file   Other Topics Concern  . None   Social History Narrative   Lives with his common law girlfriend.  Employed full time as a Arts administrator.     Additional Social History:                          Allergies:   Allergies  Allergen Reactions  . Beef-Derived Products Hives  . Pork-Derived [employer]:  Results for orders placed or performed during the hospital encounter of 08/25/14 (from the past 48 hour(s))  APTT     Status: Abnormal   Collection Time: 08/31/14  3:15 PM  Result Value Ref Range   aPTT 52 (H) 24 - 37 seconds    Comment:        IF BASELINE aPTT IS ELEVATED, SUGGEST PATIENT RISK ASSESSMENT BE USED TO DETERMINE APPROPRIATE ANTICOAGULANT THERAPY.   CBC     Status: None   Collection Time: 09/01/14  4:55 AM  Result Value Ref Range   WBC 6.7 4.0 - 10.5 K/uL   RBC 4.60 4.22 - 5.81 MIL/uL   Hemoglobin 15.5 13.0 - 17.0 g/dL   HCT 44.0 39.0 - 52.0 %   MCV 95.7 78.0 - 100.0 fL   MCH 33.7 26.0 - 34.0 pg   MCHC 35.2 30.0 - 36.0 g/dL   RDW 12.9 11.5 - 15.5 %   Platelets 211 150 - 400 K/uL  Protime-INR     Status: Abnormal   Collection Time: 09/01/14  4:55 AM  Result Value Ref Range   Prothrombin Time 22.0 (H) 11.6 - 15.2 seconds   INR 1.91 (H) 0.00 - 1.49  APTT     Status: Abnormal   Collection Time: 09/01/14  4:55 AM  Result Value Ref Range   aPTT 56 (H) 24 - 37 seconds    Comment:        IF BASELINE aPTT IS  ELEVATED, SUGGEST PATIENT RISK ASSESSMENT BE USED TO DETERMINE APPROPRIATE ANTICOAGULANT THERAPY.   Comprehensive metabolic panel     Status: Abnormal   Collection Time: 09/01/14  4:55 AM  Result Value Ref Range   Sodium 139 135 - 145 mmol/L   Potassium 3.4 (L) 3.5 - 5.1 mmol/L   Chloride 103 96 - 112 mmol/L   CO2 26 19 - 32 mmol/L   Glucose, Bld 141 (H) 70 - 99 mg/dL   BUN 21  6 - 23 mg/dL   Creatinine, Ser 1.06 0.50 - 1.35 mg/dL   Calcium 9.3 8.4 - 10.5 mg/dL   Total Protein 7.7 6.0 - 8.3 g/dL   Albumin 3.3 (L) 3.5 - 5.2 g/dL   AST 98 (H) 0 - 37 U/L   ALT 240 (H) 0 - 53 U/L   Alkaline Phosphatase 59 39 - 117 U/L   Total Bilirubin 0.7 0.3 - 1.2 mg/dL   GFR calc non Af Amer 73 (L) >90 mL/min   GFR calc Af Amer 85 (L) >90 mL/min    Comment: (NOTE) The eGFR has been calculated using the CKD EPI equation. This calculation has not been validated in all clinical situations. eGFR's persistently <90 mL/min signify possible Chronic Kidney Disease.    Anion gap 10 5 - 15  Protime-INR     Status: Abnormal   Collection Time: 09/02/14  4:45 AM  Result Value Ref Range   Prothrombin Time 22.6 (H) 11.6 - 15.2 seconds   INR 1.97 (H) 0.00 - 1.49  APTT     Status: Abnormal   Collection Time: 09/02/14  4:45 AM  Result Value Ref Range   aPTT 55 (H) 24 - 37 seconds    Comment:        IF BASELINE aPTT IS ELEVATED, SUGGEST PATIENT RISK ASSESSMENT BE USED TO DETERMINE APPROPRIATE ANTICOAGULANT THERAPY.   Basic metabolic panel     Status: Abnormal   Collection Time: 09/02/14  4:45 AM  Result Value Ref Range   Sodium 141 135 - 145 mmol/L   Potassium 3.8 3.5 - 5.1 mmol/L   Chloride 106 96 - 112 mmol/L   CO2 26 19 - 32 mmol/L   Glucose, Bld 144 (H) 70 - 99 mg/dL   BUN 21 6 - 23 mg/dL   Creatinine, Ser 1.16 0.50 - 1.35 mg/dL   Calcium 9.3 8.4 - 10.5 mg/dL   GFR calc non Af Amer 66 (L) >90 mL/min   GFR calc Af Amer 76 (L) >90 mL/min    Comment: (NOTE) The eGFR has been calculated using  the CKD EPI equation. This calculation has not been validated in all clinical situations. eGFR's persistently <90 mL/min signify possible Chronic Kidney Disease.    Anion gap 9 5 - 15    Vitals: Blood pressure 158/112, pulse 68, temperature 98 F (36.7 C), temperature source Oral, resp. rate 18, height 5' 8"  (1.727 m), weight 87.4 kg (192 lb 10.9 oz), SpO2 100 %.  Risk to Self: Is patient at risk for suicide?: No Risk to Others:   Prior Inpatient Therapy:   Prior Outpatient Therapy:    Current Facility-Administered Medications  Medication Dose Route Frequency Provider Last Rate Last Dose  . 0.9 %  sodium chloride infusion   Intravenous Continuous Belva Crome, MD      . acetaminophen (TYLENOL) tablet 650 mg  650 mg Oral Q6H PRN Venetia Maxon Rama, MD       Or  . acetaminophen (TYLENOL) suppository 650 mg  650 mg Rectal Q6H PRN Venetia Maxon Rama, MD      . alum & mag hydroxide-simeth (MAALOX/MYLANTA) 200-200-20 MG/5ML suspension 30 mL  30 mL Oral Q6H PRN Venetia Maxon Rama, MD      . atorvastatin (LIPITOR) tablet 10 mg  10 mg Oral q1800 Sahar M Osman, PA-C   10 mg at 09/01/14 1823  . bivalirudin (ANGIOMAX) 250 mg in sodium chloride 0.9 % 500 mL (0.5 mg/mL) infusion  0.22 mg/kg/hr Intravenous  Continuous Polly Cobia, RPH 37.6 mL/hr at 09/02/14 0618 0.22 mg/kg/hr at 09/02/14 0618  . cloNIDine (CATAPRES) tablet 0.2 mg  0.2 mg Oral BID Venetia Maxon Rama, MD   0.2 mg at 09/02/14 1607  . feeding supplement (ENSURE ENLIVE) (ENSURE ENLIVE) liquid 237 mL  237 mL Oral PRN Dorann Ou, RD      . FLUoxetine (PROZAC) capsule 20 mg  20 mg Oral Daily Ambrose Finland, MD   20 mg at 37/10/62 6948  . folic acid (FOLVITE) tablet 1 mg  1 mg Oral Daily Venetia Maxon Rama, MD   1 mg at 09/02/14 5462  . hydrALAZINE (APRESOLINE) injection 10 mg  10 mg Intravenous Q6H PRN Dianne Dun, NP   10 mg at 09/01/14 0519  . hydrochlorothiazide (HYDRODIURIL) tablet 25 mg  25 mg Oral Daily Venetia Maxon  Rama, MD   25 mg at 09/02/14 7035  . metoprolol (LOPRESSOR) injection 5 mg  5 mg Intravenous Q4H PRN Waldon Merl, PA-C   5 mg at 08/30/14 2305  . metoprolol (LOPRESSOR) tablet 50 mg  50 mg Oral BID Belva Crome, MD   50 mg at 09/02/14 0093  . mirtazapine (REMERON) tablet 7.5 mg  7.5 mg Oral QHS Ambrose Finland, MD   7.5 mg at 09/01/14 2142  . multivitamin with minerals tablet 1 tablet  1 tablet Oral Daily Venetia Maxon Rama, MD   1 tablet at 09/02/14 [phone]  . ondansetron (ZOFRAN) tablet 4 mg  4 mg Oral Q6H PRN Venetia Maxon Rama, MD       Or  . ondansetron (ZOFRAN) injection 4 mg  4 mg Intravenous Q6H PRN Venetia Maxon Rama, MD   4 mg at 08/25/14 1908  . pantoprazole (PROTONIX) EC tablet 40 mg  40 mg Oral Q0600 Venetia Maxon Rama, MD   40 mg at 09/01/14 0516  . potassium chloride SA (K-DUR,KLOR-CON) CR tablet 20 mEq  20 mEq Oral BID Darlin Coco, MD   20 mEq at 09/02/14 0921  . prednisoLONE tablet 30 mg  30 mg Oral Daily Venetia Maxon Rama, MD   30 mg at 09/02/14 0942  . thiamine (VITAMIN B-1) tablet 100 mg  100 mg Oral Daily Venetia Maxon Rama, MD   100 mg at 09/02/14 0920   Or  . thiamine (B-1) injection 100 mg  100 mg Intravenous Daily Venetia Maxon Rama, MD   100 mg at 08/25/14 2030  . warfarin (COUMADIN) tablet 6 mg  6 mg Oral ONCE-1800 Christine E Shade, RPH      . Warfarin - Pharmacist Dosing Inpatient   Does not apply q1800 Berton Mount, [facility]        Musculoskeletal: Strength & Muscle Tone: decreased Gait & Station: unable to stand Patient leans: N/A  Psychiatric Specialty Exam: Physical Exam as per history and physical   ROS depression, anxiety decreased psychomotor activity and recent status post alcohol withdrawal symptoms   Blood pressure 158/112, pulse 68, temperature 98 F (36.7 C), temperature source Oral, resp. rate 18, height 5' 8"  (1.727 m), weight 87.4 kg (192 lb 10.9 oz), SpO2 100 %.Body mass index is 29.3 kg/(m^2).  General Appearance: Guarded  Eye Contact::  Good   Speech:  Clear and Coherent  Volume:  Decreased  Mood:  Anxious and Depressed  Affect:  Appropriate and Congruent  Thought Process:  Coherent and Goal Directed  Orientation:  Full (Time, Place, and Person)  Thought Content:  WDL  Suicidal Thoughts:  No  Homicidal Thoughts:  No  Memory:  Immediate;   Good Recent;   Good  Judgement:  Fair  Insight:  Lacking  Psychomotor Activity:  Decreased  Concentration:  Good  Recall:  Good  Fund of Knowledge:Good  Language: Good  Akathisia:  Negative  Handed:  Right  AIMS (if indicated):     Assets:  Communication Skills Desire for Improvement Financial Resources/Insurance Housing Intimacy Leisure Time Resilience Social Support Talents/Skills Transportation  ADL's:  Impaired  Cognition: Impaired,  Mild  Sleep:      Medical Decision Making: New problem, with additional work up planned, Review of Psycho-Social Stressors (1), Review or order clinical lab tests (1), Established Problem, Worsening (2), New Problem, with no additional work-up planned (3), Review or order medicine tests (1), Review of Medication Regimen & Side Effects (2) and Review of New Medication or Change in Dosage (2)  Treatment Plan Summary: Daily contact with patient to assess and evaluate symptoms and progress in treatment and Medication management  Plan:  Continue Fluoxetine 20 mg daily for depression  Continue Remeron 7.5 mg at bedtime for insomnia Monitor for the adverse effect of the medication. Patient does not meet criteria for psychiatric inpatient admission. Supportive therapy provided about ongoing stressors. Appreciate psychiatric consultation and follow up as clinically required Please contact [phone] or [phone] if needs further assistance  Dispositipatient will be referred to the outpatient psychiatric services and medically stable and psychiatric consultation and psychiatric social service follow-up as needed.  Betzaira Mentel,JANARDHAHA  R. 09/02/2014 12:21 PM

## 2014-09-02 NOTE — Progress Notes (Signed)
SUBJECTIVE:  No complaints  OBJECTIVE:   Vitals:   Filed Vitals:   09/01/14 1151 09/01/14 2059 09/02/14 0500 09/02/14 0617  BP: 97/66 136/96 163/100 150/90  Pulse: 70 78 68   Temp:  97.9 F (36.6 C) 98 F (36.7 C)   TempSrc:  Oral Oral   Resp:  18 18   Height:      Weight:      SpO2:  99% 100%    I&O's:   Intake/Output Summary (Last 24 hours) at 09/02/14 0926 Last data filed at 09/02/14 0700  Gross per 24 hour  Intake    240 ml  Output   1825 ml  Net  -1585 ml   TELEMETRY: Reviewed telemetry pt in atrial flutter with CVR:     PHYSICAL EXAM General: Well developed, well nourished, in no acute distress Head: Eyes PERRLA, No xanthomas.   Normal cephalic and atramatic  Lungs:   Clear bilaterally to auscultation and percussion. Heart:   HRRR S1 S2 Pulses are 2+ & equal.            No carotid bruit. No JVD.  No abdominal bruits. No femoral bruits. Abdomen: Bowel sounds are positive, abdomen soft and non-tender without masses Extremities:   No clubbing, cyanosis or edema.  DP +1 Neuro: Alert and oriented X 3. Psych:  Good affect, responds appropriately   LABS: Basic Metabolic Panel:  Recent Labs  16/02/9603/10/16 0455 09/02/14 0445  NA 139 141  K 3.4* 3.8  CL 103 106  CO2 26 26  GLUCOSE 141* 144*  BUN 21 21  CREATININE 1.06 1.16  CALCIUM 9.3 9.3   Liver Function Tests:  Recent Labs  09/01/14 0455  AST 98*  ALT 240*  ALKPHOS 59  BILITOT 0.7  PROT 7.7  ALBUMIN 3.3*   No results for input(s): LIPASE, AMYLASE in the last 72 hours. CBC:  Recent Labs  08/31/14 0548 09/01/14 0455  WBC 8.0 6.7  HGB 16.1 15.5  HCT 45.9 44.0  MCV 95.6 95.7  PLT 224 211   Cardiac Enzymes: No results for input(s): CKTOTAL, CKMB, CKMBINDEX, TROPONINI in the last 72 hours. BNP: Invalid input(s): POCBNP D-Dimer: No results for input(s): DDIMER in the last 72 hours. Hemoglobin A1C: No results for input(s): HGBA1C in the last 72 hours. Fasting Lipid Panel: No results  for input(s): CHOL, HDL, LDLCALC, TRIG, CHOLHDL, LDLDIRECT in the last 72 hours. Thyroid Function Tests: No results for input(s): TSH, T4TOTAL, T3FREE, THYROIDAB in the last 72 hours.  Invalid input(s): FREET3 Anemia Panel: No results for input(s): VITAMINB12, FOLATE, FERRITIN, TIBC, IRON, RETICCTPCT in the last 72 hours. Coag Panel:   Lab Results  Component Value Date   INR 1.97* 09/02/2014   INR 1.91* 09/01/2014   INR 1.64* 08/31/2014    RADIOLOGY: Ct Head Wo Contrast  08/25/2014   CLINICAL DATA:  Ethanol abuse and seizure.  EXAM: CT HEAD WITHOUT CONTRAST  TECHNIQUE: Contiguous axial images were obtained from the base of the skull through the vertex without intravenous contrast.  COMPARISON:  None.  FINDINGS: Sinuses/Soft tissues: Clear paranasal sinuses and mastoid air cells.  Intracranial: Age advanced cerebral atrophy. Left occipital lobe hypoattenuation is most consistent with remote infarct. Mild low density in the periventricular white matter likely related to small vessel disease. No mass lesion, hemorrhage, hydrocephalus, acute infarct, intra-axial, or extra-axial fluid collection.  IMPRESSION: 1. 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 No significant mass effect to suggest acute or  subacute process. 2.  Cerebral atrophy and small vessel ischemic change.   Electronically Signed   By: Jeronimo Greaves M.D.   On: 08/25/2014 17:07   Dg Chest Port 1 View  08/25/2014   CLINICAL DATA:  Shortness of breath  EXAM: PORTABLE CHEST - 1 VIEW  COMPARISON:  None.  FINDINGS: The heart size is moderately enlarged without evidence for edema. Cardiac leads obscure detail. Both lungs are clear. The visualized skeletal structures are unremarkable.  IMPRESSION: Moderate cardiomegaly with central vascular congestion but no focal acute finding.   Electronically Signed   By: Christiana Pellant M.D.   On: 08/25/2014 15:48   Principal Problem:  Alcohol withdrawal seizure Active  Problems:  Atrial fibrillation with RVR  Alcohol dependency  Hypertension  Hyperglycemia  Thrombocytopenia  Hypokalemia  Elevated LFTs  Metabolic acidosis  Alcoholic hepatitis  Prediabetes  SOB (shortness of breath)  Seizure  ASSESSMENT AND PLAN  1. Paroxysmal atrial flutter with controlled variable ventricular response.  2. Alcohol abuse 3. Pork allergy requiring bivalirudin as anticoagulant. 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 Will need chronic anticoagulation for 6 weeks. Warfarin. 5. Abnormal LFTs improving off alcohol 6. Mild hypokalemia.Repleted 7.  HTN - BP intermittently still elevated.  Will watch trend this am. May need to discharge home on higher dose of Clonidine   Plan: TEE/cardioversion for today.   Quintella Reichert, MD  09/02/2014  9:26 AM

## 2014-09-02 NOTE — CV Procedure (Signed)
See full TEE report in [employer]; patient sedated by anesthesia with diprovan 200 mg IV total; procedure terminated prematurely due to poor pt tolerance; moderate to severe global reduction in LV function; no LAA thrombus identified; patient subsequently had DCCV with 120 J to NSR; no immediate complications; continue bivalirudin until INR >2. Olga MillersBrian Crenshaw

## 2014-09-02 NOTE — Clinical Social Work Note (Signed)
CSW met with patient along with Psychiatrist who has cleared patient for dc to home once medically stable- patient awaiting heart procedure which will be done at [facility] today- patient a bit anxious about the procedure; "I want to get my heart taken care of first and foremost". Patient reports he was here for a family wedding staying at the [place]- he acknowledges his drinking and plans to not pick it back up- He is willing and planning to follow up in his home town ([place], [place]) for outpatient support and treatment. He reports he has been through this before and seems motivated (primarily because of the cardiac scare) to abstain-  Encouragement and support provided as well as resources for [facility].  Eduard Clos, MSW, [place]

## 2014-09-02 NOTE — Transfer of Care (Signed)
Immediate Anesthesia Transfer of Care Note  Patient: Benjamin Blackwell  Procedure(s) Performed: Procedure(s): TRANSESOPHAGEAL ECHOCARDIOGRAM (TEE) (N/A) CARDIOVERSION (N/A)  Patient Location: Endoscopy Unit  Anesthesia Type:MAC  Level of Consciousness: awake, alert , oriented and patient cooperative  Airway & Oxygen Therapy: Patient Spontanous Breathing and Patient connected to nasal cannula oxygen  Post-op Assessment: Report given to RN, Post -op Vital signs reviewed and stable, Patient moving all extremities and Patient moving all extremities X 4  Post vital signs: Reviewed and stable  Last Vitals:  Filed Vitals:   09/02/14 1311  BP: 152/105  Pulse:   Temp: 36.3 C  Resp: 14    Complications: No apparent anesthesia complications

## 2014-09-02 NOTE — Progress Notes (Addendum)
  Echocardiogram Transesophageal echocardiogram has been performed.  Cathie BeamsGREGORY, Chrystine Frogge 09/02/2014, 2:37 PM

## 2014-09-02 NOTE — Progress Notes (Signed)
ANTICOAGULATION CONSULT NOTE - Follow Up Consult  Pharmacy Consult for Bivalirudin, warfarin Indication: Atrial fibrillation  Allergies  Allergen Reactions  . Beef-Derived Products Hives  . Pork-Derived Products Hives    Patient Measurements: Height: 5\' 8"  (172.7 cm) Weight: 192 lb 10.9 oz (87.4 kg) IBW/kg (Calculated) : 68.4  Vital Signs: Temp: 98 F (36.7 C) (04/11 0500) Temp Source: Oral (04/11 0500) BP: 150/90 mmHg (04/11 0617) Pulse Rate: 68 (04/11 0500)  Labs:  Recent Labs  08/31/14 0548  08/31/14 1515 09/01/14 0455 09/02/14 0445  HGB 16.1  --   --  15.5  --   HCT 45.9  --   --  44.0  --   PLT 224  --   --  211  --   APTT 49*  < > 52* 56* 55*  LABPROT 19.6*  --   --  22.0* 22.6*  INR 1.64*  --   --  1.91* 1.97*  CREATININE  --   --   --  1.06 1.16  < > = values in this interval not displayed.  Estimated Creatinine Clearance: 71 mL/min (by C-G formula based on Cr of 1.16).  Medications:  Infusions:  . sodium chloride    . bivalirudin (ANGIOMAX) infusion 0.5 mg/mL (Non-ACS indications) 0.22 mg/kg/hr (09/02/14 0618)    Assessment: [age] yoM admitted with alcohol withdrawal seizure found to be in afib with RVR and elevated troponins.  Bivalirudin started 4/4 for r/o ACS since patient has pork allergy and liver dysfunction.  Warfarin initiated 4/6 but anticoagulation was subsequently d/c'd  same day d/t concern of compliance and alcohol abuse. On 4/7 cardiology decided to pursue DCCV so recommend resume Bivalirudin and warfarin. ASA was stopped.  Today, 09/02/2014:   Day 6 warfarin bridged with bivalirudin  INR 1.97, only slightly subtherapeutic   aPTT 55, therapeutic on bivalirudin at 37.6 ml/hr  CBC wnl (4/10).  Previous Thrombocytopenia suspected d/t h/o ETOH abuse.  SCr wnl, but small increase  AST/ALT improving, 98/240 (4/10)  No bleeding/complications reported.  Potential drug interactions with warfarin: steroids (can increase anticoagulant  effect)  Diet: NPO for cardioversion, previously eating 75-100% of meals  Goal of Therapy:  INR 2-3 aPTT 50-85 seconds Monitor platelets by anticoagulation protocol: Yes   Plan:   Continue bivalirudin at 0.22 mg/kg/hr (37.6 ml/hr).    Warfarin 6 mg PO x1 tonight   Watch INR trend closely d/t liver dysfunction and EtOH abuse.    Continue bivalirudin for at least 5 days AND until INR > 2 x24hr.  (Recheck INR 4 hors after bivalirudin turned off.  If INR < 2, resume bivalirudin at the previous rate.)  Daily INR, aPTT, CBC  Plan for TEE/DCCV on Monday, 4/11   Patient has received warfarin education   Lynann BeaverChristine Joanette Silveria PharmD, BCPS Pager [phone] 09/02/2014 7:10 AM

## 2014-09-02 NOTE — Progress Notes (Signed)
PT Cancellation Note  Patient Details Name: [name] MRN: [id number] DOB: 01/01/1951   Cancelled Treatment:    Reason Eval/Treat Not Completed:  (planned tee/cardioversion today ) follow-up tomorrow for evaluation if medically stable.   [name], [name] Elizabeth 09/02/2014, 8:15 AM Blanchard KelchKaren Charie Pinkus PT [phone]

## 2014-09-02 NOTE — Anesthesia Preprocedure Evaluation (Addendum)
Anesthesia Evaluation  Patient identified by MRN, date of birth, ID band Patient awake    Reviewed: Allergy & Precautions, NPO status , Patient's Chart, lab work & pertinent test results  History of Anesthesia Complications Negative for: history of anesthetic complications  Airway Mallampati: II  TM Distance: >3 FB Neck ROM: Full    Dental  (+) Missing, Dental Advisory Given   Pulmonary neg pulmonary ROS,    Pulmonary exam normal       Cardiovascular hypertension, Rhythm:Irregular Rate:Tachycardia     Neuro/Psych Seizures -,  negative psych ROS   GI/Hepatic negative GI ROS, (+)     substance abuse  alcohol use, Hepatitis -  Endo/Other    Renal/GU      Musculoskeletal   Abdominal   Peds  Hematology   Anesthesia Other Findings   Reproductive/Obstetrics                            Anesthesia Physical Anesthesia Plan  ASA: III  Anesthesia Plan: MAC   Post-op Pain Management:    Induction: Intravenous  Airway Management Planned: Mask  Additional Equipment:   Intra-op Plan:   Post-operative Plan:   Informed Consent: I have reviewed the patients History and Physical, chart, labs and discussed the procedure including the risks, benefits and alternatives for the proposed anesthesia with the patient or authorized representative who has indicated his/her understanding and acceptance.   Dental advisory given  Plan Discussed with: CRNA and Anesthesiologist  Anesthesia Plan Comments:        Anesthesia Quick Evaluation

## 2014-09-03 ENCOUNTER — Ambulatory Visit (HOSPITAL_COMMUNITY)
Admit: 2014-09-03 | Discharge: 2014-09-03 | Disposition: A | Discharge status: Home / Self Care | Payer: Self-pay | Attending: Cardiology | Admitting: Cardiology

## 2014-09-03 ENCOUNTER — Other Ambulatory Visit: Payer: Self-pay

## 2014-09-03 ENCOUNTER — Ambulatory Visit (HOSPITAL_COMMUNITY)
Admit: 2014-09-03 | Discharge: 2014-09-03 | Disposition: A | Discharge status: Home / Self Care | Payer: Self-pay | Source: Ambulatory Visit | Attending: Cardiology | Admitting: Cardiology

## 2014-09-03 ENCOUNTER — Encounter (HOSPITAL_COMMUNITY): Payer: Self-pay | Admitting: Cardiology

## 2014-09-03 DIAGNOSIS — R7989 Other specified abnormal findings of blood chemistry: Secondary | ICD-10-CM

## 2014-09-03 DIAGNOSIS — R778 Other specified abnormalities of plasma proteins: Secondary | ICD-10-CM | POA: Diagnosis present

## 2014-09-03 DIAGNOSIS — I4892 Unspecified atrial flutter: Secondary | ICD-10-CM

## 2014-09-03 DIAGNOSIS — R079 Chest pain, unspecified: Secondary | ICD-10-CM

## 2014-09-03 DIAGNOSIS — I483 Typical atrial flutter: Secondary | ICD-10-CM

## 2014-09-03 HISTORY — PX: NM MYOVIEW LTD: HXRAD82

## 2014-09-03 LAB — PROTIME-INR
INR: 2.31 — ABNORMAL HIGH (ref 0.00–1.49)
INR: 1.42 (ref 0.00–1.49)
Prothrombin Time: 25.6 seconds — ABNORMAL HIGH (ref 11.6–15.2)
Prothrombin Time: 17.5 seconds — ABNORMAL HIGH (ref 11.6–15.2)

## 2014-09-03 LAB — CBC
HCT: 40.3 % (ref 39.0–52.0)
HEMOGLOBIN: 14.2 g/dL (ref 13.0–17.0)
MCH: 34.3 pg — ABNORMAL HIGH (ref 26.0–34.0)
MCHC: 35.2 g/dL (ref 30.0–36.0)
MCV: 97.3 fL (ref 78.0–100.0)
Platelets: 235 10*3/uL (ref 150–400)
RBC: 4.14 MIL/uL — AB (ref 4.22–5.81)
RDW: 13.4 % (ref 11.5–15.5)
WBC: 5.9 10*3/uL (ref 4.0–10.5)

## 2014-09-03 LAB — APTT
APTT: 59 s — AB (ref 24–37)
APTT: 59 s — AB (ref 24–37)
aPTT: 63 seconds — ABNORMAL HIGH (ref 24–37)

## 2014-09-03 MED ORDER — THIAMINE HCL 100 MG PO TABS: 100.0000 mg | ORAL_TABLET | Freq: Every day | ORAL | Status: AC

## 2014-09-03 MED ORDER — WARFARIN SODIUM 5 MG PO TABS: 5.0000 mg | ORAL_TABLET | Freq: Once | ORAL | Status: DC

## 2014-09-03 MED ORDER — PANTOPRAZOLE SODIUM 40 MG PO TBEC: 40.0000 mg | DELAYED_RELEASE_TABLET | Freq: Every day | ORAL | Status: DC

## 2014-09-03 MED ORDER — SODIUM CHLORIDE 0.9 % IV SOLN
0.2200 mg/kg/h | INTRAVENOUS | Status: AC
  Administered 2014-09-03 (×2): 0.22 mg/kg/h via INTRAVENOUS
  Filled 2014-09-03 (×3): qty 250

## 2014-09-03 MED ORDER — TECHNETIUM TC 99M SESTAMIBI GENERIC - CARDIOLITE
30.0000 | Freq: Once | INTRAVENOUS | Status: AC | PRN
  Administered 2014-09-03: 30 via INTRAVENOUS

## 2014-09-03 MED ORDER — REGADENOSON 0.4 MG/5ML IV SOLN
0.4000 mg | Freq: Once | INTRAVENOUS | Status: AC
  Administered 2014-09-03: 0.4 mg via INTRAVENOUS

## 2014-09-03 MED ORDER — ATORVASTATIN CALCIUM 10 MG PO TABS: 10.0000 mg | ORAL_TABLET | Freq: Every day | ORAL | Status: DC

## 2014-09-03 MED ORDER — ADULT MULTIVITAMIN W/MINERALS CH: 1.0000 | ORAL_TABLET | Freq: Every day | ORAL | Status: AC

## 2014-09-03 MED ORDER — PREDNISOLONE 5 MG PO TABS
20.0000 mg | ORAL_TABLET | Freq: Every day | ORAL | Status: DC
  Administered 2014-09-03 – 2014-09-04 (×2): 20 mg via ORAL
  Filled 2014-09-03 (×2): qty 4

## 2014-09-03 MED ORDER — CLONIDINE HCL 0.2 MG PO TABS: 0.2000 mg | ORAL_TABLET | Freq: Two times a day (BID) | ORAL | Status: DC

## 2014-09-03 MED ORDER — WARFARIN SODIUM 5 MG PO TABS
7.5000 mg | ORAL_TABLET | Freq: Once | ORAL | Status: AC
  Administered 2014-09-03: 7.5 mg via ORAL
  Filled 2014-09-03 (×2): qty 1

## 2014-09-03 MED ORDER — REGADENOSON 0.4 MG/5ML IV SOLN
INTRAVENOUS | Status: AC
Start: 2014-09-03 — End: 2014-09-03
  Filled 2014-09-03: qty 5

## 2014-09-03 MED ORDER — METOPROLOL TARTRATE 50 MG PO TABS: 50.0000 mg | ORAL_TABLET | Freq: Two times a day (BID) | ORAL | Status: DC

## 2014-09-03 MED ORDER — TECHNETIUM TC 99M SESTAMIBI GENERIC - CARDIOLITE
10.0000 | Freq: Once | INTRAVENOUS | Status: AC | PRN
  Administered 2014-09-03: 10 via INTRAVENOUS

## 2014-09-03 MED ORDER — MIRTAZAPINE 7.5 MG PO TABS: 7.5000 mg | ORAL_TABLET | Freq: Every day | ORAL | Status: DC

## 2014-09-03 MED ORDER — REGADENOSON 0.4 MG/5ML IV SOLN
0.4000 mg | Freq: Once | INTRAVENOUS | Status: DC
Start: 2014-09-03 — End: 2014-09-04

## 2014-09-03 MED ORDER — WARFARIN SODIUM 5 MG PO TABS: 7.5000 mg | ORAL_TABLET | Freq: Once | ORAL | Status: DC

## 2014-09-03 MED ORDER — FOLIC ACID 1 MG PO TABS: 1.0000 mg | ORAL_TABLET | Freq: Every day | ORAL | Status: AC

## 2014-09-03 MED ORDER — FLUOXETINE HCL 20 MG PO CAPS: 20.0000 mg | ORAL_CAPSULE | Freq: Every day | ORAL | Status: DC

## 2014-09-03 MED ORDER — POTASSIUM CHLORIDE CRYS ER 20 MEQ PO TBCR: 20.0000 meq | EXTENDED_RELEASE_TABLET | Freq: Every day | ORAL | Status: DC

## 2014-09-03 MED ORDER — HYDROCHLOROTHIAZIDE 25 MG PO TABS: 25.0000 mg | ORAL_TABLET | Freq: Every day | ORAL | Status: DC

## 2014-09-03 NOTE — Progress Notes (Addendum)
Patient ID: Benjamin Blackwell, male   DOB: 05/13/1951, 64 y.o.   MRN: [id number] [facility] PROGRESS NOTE  Benjamin Blackwell [id number] DOB: 03/04/1951 DOA: 08/25/2014 PCP: No primary care provider on file.  Brief narrative:    64 y.o. male with past medical history of alcohol abuse and alcohol dependence who [facility] with alcohol related withdrawal seizures.  Hospital course was complicated with finding of atrial fibrillation with RVR. Cardiology has seen the patient in consultation and pt underwent TEE cardioversion 09/02/2014 and requirement is to continue 6 weeks of Coumadin therapy on discharge. Of note, he has completed detox therapy for alcohol dependency.  He was ready for discharge today but cardiology has recommended further risk stratification with stress test and then his INR fell to sub therapeutic level. Angiomax drip was resumed today to help bridge with coumadin. I spoke with cardio about this pt being on coumadin considering his h/o alcohol abuse. Per cardio, this is the only thing we can give him so this pt will need strict follow up to make sure he completes his AC x 6 weeks.   Assessment/Plan:    Principal Problem: Alcohol related withdrawal seizures / history of alcohol abuse and alcohol dependence - Patient was on CIWA protocol on admission. Detox completed. - No withdrawals or seizures reported. - Continue folic acid, multivitamin and thiamine.  Active Problems: Hypokalemia - Electrolyte depletion secondary to alcohol abuse, Hctz. - Potassium supplemented and WNL.  Metabolic acidosis - Secondary to alcohol abuse.  - CO2 normalized.   Atrial fibrillation/flutter with RVR /  - CHADS vasc score 1 - Underwent TEE cardioversion 09/02/2014. - Rate controlled with metoprolol.  - Needs 6 weeks coumadin on d/c. Now also on angiomax drip since INR sub therapeutic. - 2-D echo on this admission with normal ejection fraction.  ST elevations  in inferior leads / Troponin elevation - ST elevation noted in inferior leads on 12-lead EKG in addition to mild elevation in troponin level.  - Thought to be due to demand ischemia from A fib. - Stress test today. Follow up results.  - AC with coumadin for 6 weeks.   Essential hypertension - BP 103/67. Continue clonidine 0.2 mg twice daily, HCTZ 25 mg daily, metoprolol 50 mg twice daily.   Elevated LFTs secondary to alcoholic hepatitis  - Secondary to alcohol-induced hepatitis. - LFTs improved with steroids. Tomorrow prednisone 10 mg and then stop. No need for prednisone on discharge. Stop order placed. - Continue PPI therapy.    Dyslipidemia - Continue Lipitor 10 mg daily.   Hyperglycemia/prediabetes - Hemoglobin A1c 5.8%. - Pt was counseled on diet.    Depression - Stable. Continue Prozac.   Thrombocytopenia - Due to bone marrow suppression from alcohol abuse.  - Resolved.    DVT Prophylaxis  - On full dose anticoagulation with Coumadin. Also on angiomax drip to bridge with coumadin since INR sub therapeutic    Code Status: Full.  Family Communication:  plan of care discussed with the patient. No family at the bedside.  Disposition Plan: stress test today. Home once INR 2-3.  IV access:  Peripheral IV  Procedures and diagnostic studies:    Ct Head Wo Contrast 08/25/2014: 1. 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 No significant mass effect to suggest acute or subacute process. 2. Cerebral atrophy and small vessel ischemic change.   Dg Chest Port 1 View 08/25/2014: Moderate cardiomegaly with central vascular congestion but no focal acute finding.  2-D echo 08/27/14:  Medical Consultants:  Cardiology  Other Consultants:  None   IAnti-Infectives:   None    [name], MD  [facility] Pager [phone]  If 7PM-7AM, please contact night-coverage [email or web address] Password [facility] 09/03/2014, 2:56 PM   LOS: 9  days    HPI/Subjective: No acute overnight events. Patient says he feels tired this am.  Objective: Filed Vitals:   09/03/14 1108 09/03/14 1110 09/03/14 1331 09/03/14 1419  BP: 118/67 103/67 130/83 103/67  Pulse: 80 76 72 58  Temp:    97.6 F (36.4 C)  TempSrc:    Oral  Resp:    20  Height:      Weight:      SpO2:    100%    Intake/Output Summary (Last 24 hours) at 09/03/14 1456 Last data filed at 09/03/14 0700  Gross per 24 hour  Intake 2128.72 ml  Output    625 ml  Net 1503.72 ml    Exam:   General:  Pt is alert, follows commands appropriately, not in acute distress  Cardiovascular: Rate controlled, S1/S2, no murmurs  Respiratory: Clear to auscultation bilaterally, no wheezing, no crackles, no rhonchi  Abdomen: Soft, non tender, non distended, bowel sounds present  Extremities: No edema, pulses DP and PT palpable bilaterally  Neuro: Grossly nonfocal  Data Reviewed: Basic Metabolic Panel:  Recent Labs Lab 08/28/14 0333 08/29/14 0446 09/01/14 0455 09/02/14 0445  NA 138 138 139 141  K 3.9 3.6 3.4* 3.8  CL 103 102 103 106  CO2 GLUCOSE 134* 148* 141* 144*  BUN CREATININE 0.82 1.08 1.06 1.16  CALCIUM 9.2 9.3 9.3 9.3   Liver Function Tests:  Recent Labs Lab 08/28/14 0333 08/29/14 0446 09/01/14 0455  AST 444* 203* 98*  ALT 551* 381* 240*  ALKPHOS 58 62 59  BILITOT 1.3* 0.8 0.7  PROT 7.8 7.8 7.7  ALBUMIN 3.6 3.7 3.3*   No results for input(s): LIPASE, AMYLASE in the last 168 hours. No results for input(s): AMMONIA in the last 168 hours. CBC:  Recent Labs Lab 08/29/14 0446 08/30/14 0047 08/31/14 0548 09/01/14 0455 09/03/14 0436  WBC 6.0 5.8 8.0 6.7 5.9  HGB 14.8 15.0 16.1 15.5 14.2  HCT 42.2 41.9 45.9 44.0 40.3  MCV 95.5 95.2 95.6 95.7 97.3  PLT 172 174 224 211 235   Cardiac Enzymes: No results for input(s): CKTOTAL, CKMB, CKMBINDEX, TROPONINI in the last 168 hours. BNP: Invalid input(s): POCBNP CBG: No  results for input(s): GLUCAP in the last 168 hours.  Recent Results (from the past 240 hour(s))  MRSA PCR Screening     Status: None   Collection Time: 08/25/14  6:40 PM  Result Value Ref Range Status   MRSA by PCR NEGATIVE NEGATIVE Final     Scheduled Meds: . atorvastatin  10 mg Oral q1800  . cloNIDine  0.2 mg Oral BID  . FLUoxetine  20 mg Oral Daily  . folic acid  1 mg Oral Daily  . hydrochlorothiazide  25 mg Oral Daily  . metoprolol tartrate  50 mg Oral BID  . mirtazapine  7.5 mg Oral QHS  . multivitamin with minerals  1 tablet Oral Daily  . pantoprazole  40 mg Oral Q0600  . potassium chloride  20 mEq Oral BID  . prednisoLONE  30 mg Oral Daily  . thiamine  100 mg Oral Daily   Or  . thiamine  100 mg Intravenous Daily  .  warfarin  6 mg Oral ONCE-1800   Continuous Infusions: . bivalirudin (ANGIOMAX) infusion 0.5 mg/mL (Non-ACS indications) 0.22 mg/kg/hr (09/03/14 1428)

## 2014-09-03 NOTE — Progress Notes (Signed)
Lexiscan Myoview tolerated well, images pending.  Corine ShelterLUKE Johnryan Sao PA-C 09/03/2014 11:09 AM

## 2014-09-03 NOTE — Progress Notes (Signed)
Discussed Myoview results with Dr Jens Somrenshaw. Scan high risk secondary to LVD, no ischemia noted. Plan is to continue Coumadin.  Consider coronary evaluation after at least 4 weeks of anticoagulation after recent cardioversion.   Corine ShelterLUKE Torria Fromer PA-C 09/03/2014 5:25 PM

## 2014-09-03 NOTE — Progress Notes (Addendum)
ANTICOAGULATION CONSULT NOTE - Follow Up Consult  Pharmacy Consult for Bivalirudin Indication: Atrial fibrillation  See pharmacist note earlier today for further detail.  Patient's bivalirudin infusion was resumed this afternoon after stress test since INR returned subtherapeutic.  Continuing bivalirudin while INR<2.0.    aPTT therapeutic at 63 seconds with infusion at 0.22 mg/kg/hr (37.6 ml/hr)  Plan: 1.  Continue bivalirudin infusion at 0.22 mg/kg/hr (37.6 ml/hr) 2.  Repeat aPTT in 4 hours.  If remains therapeutic, can reduce aPTT monitoring to daily.  Clance BollAmanda Mc Hollen, PharmD, BCPS Pager: [phone] 09/03/2014 8:01 PM   Addendum: 09/03/2014 9:55 PM Repeat aPTT remains therapeutic x 2.  aPTT 59 seconds.   Plan:  1.  Continue bivalirudin infusion at 0.22 mg/kg/hr (37.6 ml/hr) 2.  Daily aPTT.  F/u in AM.  Clance BollAmanda Mathhew Buysse, PharmD, BCPS Pager: [phone] 09/03/2014 9:56 PM

## 2014-09-03 NOTE — Progress Notes (Signed)
PT Cancellation Note  Patient Details Name: Albertina Senegalrvin Gelin MRN: [id number] DOB: 07/16/1950   Cancelled Treatment:    Reason Eval/Treat Not Completed: Patient at procedure or test/unavailable [facility])   Kinser Fellman,KATHrine E 09/03/2014, 10:36 AM Zenovia JarredKati Brecklynn Jian, PT, DPT 09/03/2014 Pager: [phone]

## 2014-09-03 NOTE — Progress Notes (Addendum)
ANTICOAGULATION CONSULT NOTE - Follow Up Consult  Pharmacy Consult for Bivalirudin, warfarin Indication: Atrial fibrillation  Allergies  Allergen Reactions  . Beef-Derived Products Hives  . Pork-Derived Products Hives    Patient Measurements: Height: 5\' 8"  (172.7 cm) Weight: 192 lb 10.9 oz (87.4 kg) IBW/kg (Calculated) : 68.4  Vital Signs: Temp: 98.3 F (36.8 C) (04/12 0536) Temp Source: Oral (04/12 0536) BP: 132/88 mmHg (04/12 0536) Pulse Rate: 65 (04/12 0536)  Labs:  Recent Labs  09/01/14 0455 09/02/14 0445 09/03/14 0436  HGB 15.5  --  14.2  HCT 44.0  --  40.3  PLT 211  --  235  APTT 56* 55* 59*  LABPROT 22.0* 22.6* 25.6*  INR 1.91* 1.97* 2.31*  CREATININE 1.06 1.16  --     Estimated Creatinine Clearance: 71 mL/min (by C-G formula based on Cr of 1.16).  Medications:  Infusions:  . bivalirudin (ANGIOMAX) infusion 0.5 mg/mL (Non-ACS indications) 0.22 mg/kg/hr (09/02/14 1940)    Assessment: Benjamin Blackwell admitted with alcohol withdrawal seizure found to be in afib with RVR and elevated troponins.  Bivalirudin started 4/4 for r/o ACS since patient has pork allergy and liver dysfunction.  Warfarin initiated 4/6 but anticoagulation was subsequently d/c'd  same day d/t concern of compliance and alcohol abuse. On 4/7 cardiology decided to pursue DCCV so recommend resume Bivalirudin and warfarin. ASA was stopped.  Today, 09/03/2014:   Day 7 warfarin bridged with bivalirudin  INR 2.31, therapeutic   aPTT 59, therapeutic on bivalirudin at 37.6 ml/hr  CBC wnl.  Previous Thrombocytopenia suspected d/t h/o ETOH abuse.  SCr wnl, but small increase (4/11)  AST/ALT improving, 98/240 (4/10)  No bleeding/complications reported.  Potential drug interactions with warfarin: steroids (can increase anticoagulant effect)  Diet: Regular, eating 75-100% of meals  Goal of Therapy:  INR 2-3 aPTT 50-85 seconds Monitor platelets by anticoagulation protocol: Yes   Plan:   D/C  Bivalirudin  Recheck INR 4 hors after bivalirudin turned off.    If INR < 2, resume bivalirudin at the previous rate of 0.22 mg/kg/hr (37.6 ml/hr).    Warfarin 5 mg PO x1 tonight   Watch INR trend closely d/t liver dysfunction and EtOH abuse.    Daily INR, aPTT, CBC  Patient has received warfarin education  Lynann BeaverChristine Damonie Ellenwood PharmD, BCPS Pager [phone] 09/03/2014 7:06 AM    Addendum:   Bivalirudin infusion d/c 4/13 at 0735 [facility] and returned at ~1330 INR drawn ~ 6 hours after bivalirudin infusion was stopped is SUBtherapeutic at 1.42  Plan:  Resume bivalirudin at the previous rate of 0.22 mg/kg/hr (37.6 ml/hr).   Check aPTT at 2 hours after initiation then q4h until therapeutic x2 then daily.   Increase tonight's warfarin dose to 7.5mg  PO x1 at [address]   Tequila Rottmann PharmD, BCPS Pager [phone] 09/03/2014 2:30 PM

## 2014-09-03 NOTE — Progress Notes (Signed)
SUBJECTIVE:  No complaints  OBJECTIVE:   Vitals:   Filed Vitals:   09/02/14 1527 09/02/14 1800 09/02/14 2157 09/03/14 0536  BP: 104/66 100/65 102/64 132/88  Pulse: 59 75 72 65  Temp:   98.1 F (36.7 C) 98.3 F (36.8 C)  TempSrc:   Oral Oral  Resp: 20  18 18   Height:      Weight:      SpO2: 100%  100% 99%   I&O's:   Intake/Output Summary (Last 24 hours) at 09/03/14 0743 Last data filed at 09/03/14 0700  Gross per 24 hour  Intake 2448.72 ml  Output   1025 ml  Net 1423.72 ml   TELEMETRY: Reviewed telemetry pt in NSR:     PHYSICAL EXAM General: Well developed, well nourished, in no acute distress Head: Eyes PERRLA, No xanthomas.   Normal cephalic and atramatic  Lungs:   Clear bilaterally to auscultation and percussion. Heart:   HRRR S1 S2 Pulses are 2+ & equal. Abdomen: Bowel sounds are positive, abdomen soft and non-tender without masses Extremities:   No clubbing, cyanosis or edema.  DP +1 Neuro: Alert and oriented X 3. Psych:  Good affect, responds appropriately   LABS: Basic Metabolic Panel:  Recent Labs  04/54/903/03/09 0455 09/02/14 0445  NA 139 141  K 3.4* 3.8  CL 103 106  CO2 26 26  GLUCOSE 141* 144*  BUN 21 21  CREATININE 1.06 1.16  CALCIUM 9.3 9.3   Liver Function Tests:  Recent Labs  09/01/14 0455  AST 98*  ALT 240*  ALKPHOS 59  BILITOT 0.7  PROT 7.7  ALBUMIN 3.3*   No results for input(s): LIPASE, AMYLASE in the last 72 hours. CBC:  Recent Labs  09/01/14 0455 09/03/14 0436  WBC 6.7 5.9  HGB 15.5 14.2  HCT 44.0 40.3  MCV 95.7 97.3  PLT 211 235   Cardiac Enzymes: No results for input(s): CKTOTAL, CKMB, CKMBINDEX, TROPONINI in the last 72 hours. BNP: Invalid input(s): POCBNP D-Dimer: No results for input(s): DDIMER in the last 72 hours. Hemoglobin A1C: No results for input(s): HGBA1C in the last 72 hours. Fasting Lipid Panel: No results for input(s): CHOL, HDL, LDLCALC, TRIG, CHOLHDL, LDLDIRECT in the last 72 hours. Thyroid  Function Tests: No results for input(s): TSH, T4TOTAL, T3FREE, THYROIDAB in the last 72 hours.  Invalid input(s): FREET3 Anemia Panel: No results for input(s): VITAMINB12, FOLATE, FERRITIN, TIBC, IRON, RETICCTPCT in the last 72 hours. Coag Panel:   Lab Results  Component Value Date   INR 2.31* 09/03/2014   INR 1.97* 09/02/2014   INR 1.91* 09/01/2014    RADIOLOGY: Ct Head Wo Contrast  08/25/2014   CLINICAL DATA:  Ethanol abuse and seizure.  EXAM: CT HEAD WITHOUT CONTRAST  TECHNIQUE: Contiguous axial images were obtained from the base of the skull through the vertex without intravenous contrast.  COMPARISON:  None.  FINDINGS: Sinuses/Soft tissues: Clear paranasal sinuses and mastoid air cells.  Intracranial: Age advanced cerebral atrophy. Left occipital lobe hypoattenuation is most consistent with remote infarct. Mild low density in the periventricular white matter likely related to small vessel disease. No mass lesion, hemorrhage, hydrocephalus, acute infarct, intra-axial, or extra-axial fluid collection.  IMPRESSION: 1. 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 No significant mass effect to suggest acute or subacute process. 2.  Cerebral atrophy and small vessel ischemic change.   Electronically Signed   By: Jeronimo GreavesKyle  Talbot M.D.   On: 08/25/2014 17:07   Dg  Chest Port 1 View  08/25/2014   CLINICAL DATA:  Shortness of breath  EXAM: PORTABLE CHEST - 1 VIEW  COMPARISON:  None.  FINDINGS: The heart size is moderately enlarged without evidence for edema. Cardiac leads obscure detail. Both lungs are clear. The visualized skeletal structures are unremarkable.  IMPRESSION: Moderate cardiomegaly with central vascular congestion but no focal acute finding.   Electronically Signed   By: Christiana Pellant M.D.   On: 08/25/2014 15:48    ASSESSMENT AND PLAN  1. Paroxysmal atrial flutter with controlled variable ventricular response. Now s/p TEE/DCCV to NSR.  Maintaining NSR  on metoprolol.   2. Alcohol abuse 3. Pork allergy requiring bivalirudin as anticoagulant. Patient states anaphylaxis with pork. Will need chronic anticoagulation for 6 weeks. INR therapeutic so ok to stop Bivalirudin.   4. Abnormal LFTs improving off alcohol 5. Mild hypokalemia.Repleted 6. HTN - Controlled on clonidine/HCTZ and BB 7.  Elevated Troponin - mildly elevated with flat trend and normal LVF on echo.  Most likely demand ischemia in setting of rapid afib/flutter.  He will need stress testing for further risk stratification.  I have recommended proceeding with this today given risk of not following up as outpt.  If stress test low risk then ok for d/c home from cardiac standpoint today.   Quintella Reichert, MD  09/03/2014  7:43 AM

## 2014-09-03 NOTE — Progress Notes (Signed)
Patient back from stress test, alert and oriented, denies any pain/distress, in bed resting, tele monitor back on, Dr. Elisabeth Pigeonevine notified. Will continue to monitor patient.

## 2014-09-03 NOTE — Progress Notes (Signed)
Patient ID: Benjamin Blackwell, male   DOB: 01/19/1951, 64 y.o.   MRN: [id number]  Due to technical problems with the radiology reporting system, this is a preliminary report for the patient's Lexiscan Cardiolite:   No ECG changes with Lexiscan infusion.   There was a medium-sized, severe basal to mid inferior and inferolateral perfusion defect at both rest and with stress.  EF 10% with diffuse hypokinesis.   High risk study due to low EF.  Fixed severe basal to mid inferior and inferolateral defect suggests prior MI, no evidence for significant ischemia.  The degree of depression of ejection fraction is not totally explained by the defect.   Benjamin Blackwell 09/03/2014 4:48 PM

## 2014-09-04 LAB — CBC
HCT: 40.5 % (ref 39.0–52.0)
Hemoglobin: 13.9 g/dL (ref 13.0–17.0)
MCH: 33.3 pg (ref 26.0–34.0)
MCHC: 34.3 g/dL (ref 30.0–36.0)
MCV: 96.9 fL (ref 78.0–100.0)
Platelets: 241 10*3/uL (ref 150–400)
RBC: 4.18 MIL/uL — AB (ref 4.22–5.81)
RDW: 13 % (ref 11.5–15.5)
WBC: 9.3 10*3/uL (ref 4.0–10.5)

## 2014-09-04 LAB — PROTIME-INR
INR: 2.42 — ABNORMAL HIGH (ref 0.00–1.49)
INR: 1.55 — ABNORMAL HIGH (ref 0.00–1.49)
Prothrombin Time: 26.5 seconds — ABNORMAL HIGH (ref 11.6–15.2)
Prothrombin Time: 18.8 seconds — ABNORMAL HIGH (ref 11.6–15.2)

## 2014-09-04 LAB — APTT
APTT: 64 s — AB (ref 24–37)
APTT: 62 s — AB (ref 24–37)
aPTT: 63 seconds — ABNORMAL HIGH (ref 24–37)

## 2014-09-04 MED ORDER — WARFARIN SODIUM 5 MG PO TABS
10.0000 mg | ORAL_TABLET | Freq: Once | ORAL | Status: AC
Start: 2014-09-04 — End: 2014-09-04
  Administered 2014-09-04: 10 mg via ORAL
  Filled 2014-09-04: qty 2

## 2014-09-04 MED ORDER — WARFARIN SODIUM 5 MG PO TABS: 7.5000 mg | ORAL_TABLET | Freq: Once | ORAL | Status: DC

## 2014-09-04 MED ORDER — SODIUM CHLORIDE 0.9 % IV SOLN
0.2200 mg/kg/h | INTRAVENOUS | Status: DC
  Administered 2014-09-04 – 2014-09-05 (×2): 0.22 mg/kg/h via INTRAVENOUS
  Filled 2014-09-04 (×2): qty 250

## 2014-09-04 MED ORDER — RAMIPRIL 2.5 MG PO CAPS
2.5000 mg | ORAL_CAPSULE | Freq: Every day | ORAL | Status: DC
  Administered 2014-09-04 – 2014-09-05 (×2): 2.5 mg via ORAL
  Filled 2014-09-04 (×3): qty 1

## 2014-09-04 NOTE — Consult Note (Signed)
Psychiatry Consult follow-up  Reason for Consult:  Alcohol withdrawal with seizure, alcoholic hepatitis Referring Physician:  Dr. Darnelle Catalan Patient Identification: Benjamin Blackwell MRN:  [id number] Principal Diagnosis: Alcohol withdrawal seizure Diagnosis:   Patient Active Problem List   Diagnosis Date Noted  . Elevated troponin [R79.89] 09/03/2014  . Atrial flutter [I48.92] 09/02/2014  . Seizure [R56.9]   . Prediabetes [R73.09] 08/28/2014  . SOB (shortness of breath) [R06.02]   . Alcoholic hepatitis [K70.10] 08/27/2014  . Hypokalemia [E87.6] 08/26/2014  . Elevated LFTs [R79.89] 08/26/2014  . Metabolic acidosis [E87.2] 08/26/2014  . Atrial fibrillation with RVR [I48.91] 08/25/2014  . Alcohol withdrawal seizure [F10.239] 08/25/2014  . Alcohol dependency [F10.20] 08/25/2014  . Hypertension [I10] 08/25/2014  . Hyperglycemia [R73.9] 08/25/2014  . Thrombocytopenia [D69.6] 08/25/2014    Total Time spent with patient: 30 minutes  Subjective:   Benjamin Blackwell is a 64 y.o. male patient admitted with status post alcohol withdrawal seizures.  HPI:  Benjamin Blackwell is an 64 y.o. male seen, chart reviewed and case discussed with the Dr. Darnelle Catalan and psychiatric social service. Patient reported he has been depressed secondary to losing his mother and father when he was young. Patient reportedly dropped out of school and started working as a teenager. Patient married and has a child who is 33 years old. Patient reportedly never received medication management for depression or counseling. Patient denies current symptoms of suicidal, homicidal ideation, intention or plans. Patient reportedly drinking mostly 3 beer before going to the work and 6 pack after work since he was 65 years old. Patient has a previous of sober one time about 7 days and other time about 6 months. Patient was involved with DWI twice and was forced 4 substance abuse classes in the past. 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 Patient reportedly visiting [place] to attend a nephew wedding and then filed down with the alcohol withdrawal seizure. Patient reported he has a past history of alcohol withdrawal seizures 2. Patient stated he promised his wife is going to quit for good now and willing to receive substance abuse rehabilitation treatment upon discharge from the hospital in his county. Patient reported his wife is supportive to him. Patient is willing to try antidepressant medication for depression and insomnia.   09/04/2014  Interval history: Patient has been stable on his current symptoms of depression, anxiety and has been free from alcohol withdrawal symptoms. Patient reported he had cardiac stress test and reportedly he will be referred to out patient treatment at this time. Psych social service has been in contact with patient sister who encourage him to get substance abuse treatment. Patient is willing to participate in out patient substance abuse treatment in his town, [place] - [facility]. Patient has been taking medication as prescribed, tolerated well and has no reported side effects. Psychiatric social service following with the patient regarding outpatient substance abuse and mental health resources. Patient has no safety concerns, and denied current suicidal/homicidal ideation, intention or plans. Patient has no evidence of psychotic symptoms. Psych consultation service will sign off today, please contact us if we can be further help with the patient.    Past Medical History:  Past Medical History  Diagnosis Date  . Hypertension   . ETOH abuse   . Seizures     ETOH related  . A-fib  Past Surgical History  Procedure Laterality Date  . Shoulder surgery      left shoulder  . Tee without cardioversion N/A 09/02/2014    Procedure: TRANSESOPHAGEAL ECHOCARDIOGRAM (TEE);   Surgeon: Lewayne BuntingBrian S Crenshaw, MD;  [facility];  Service: Cardiovascular;  Laterality: N/A;  . Cardioversion N/A 09/02/2014    Procedure: CARDIOVERSION;  Surgeon: Lewayne BuntingBrian S Crenshaw, MD;  [facility];  Service: Cardiovascular;  Laterality: N/A;   Family History:  Family History  Problem Relation Age of Onset  . Heart attack Mother   . Heart attack Father   . Heart attack Brother   . Heart attack Brother   . Diabetes Father   . Diabetes Brother   . Diabetes Sister    Social History:  History  Alcohol Use  . 0.0 oz/week  . 0 Standard drinks or equivalent per week    Comment: Daily, 12 pack a day, sometimes more, occasional liquor     History  Drug Use  . Yes    Comment: Marijuana    History   Social History  . Marital Status: Divorced    Spouse Name: N/A  . Number of Children: 1  . Years of Education: N/A   Occupational History  . Laborer    Social History Main Topics  . Smoking status: Never Smoker   . Smokeless tobacco: Not on file  . Alcohol Use: 0.0 oz/week    0 Standard drinks or equivalent per week     Comment: Daily, 12 pack a day, sometimes more, occasional liquor  . Drug Use: Yes     Comment: Marijuana  . Sexual Activity: Not on file   Other Topics Concern  . None   Social History Narrative   Lives with his common law girlfriend.  Employed full time as a Nature conservation officerlaborer.     Additional Social History:                          Allergies:   Allergies  Allergen Reactions  . Beef-Derived Products Hives  . Pork-Derived [employer] Hives    Labs:  Results for orders placed or performed during the hospital encounter of 08/25/14 (from the past 48 hour(s))  Protime-INR     Status: Abnormal   Collection Time: 09/03/14  4:36 AM  Result Value Ref Range   Prothrombin Time 25.6 (H) 11.6 - 15.2 seconds   INR 2.31 (H) 0.00 - 1.49  APTT     Status: Abnormal   Collection Time: 09/03/14  4:36 AM  Result Value Ref Range   aPTT 59 (H) 24 - 37  seconds    Comment:        IF BASELINE aPTT IS ELEVATED, SUGGEST PATIENT RISK ASSESSMENT BE USED TO DETERMINE APPROPRIATE ANTICOAGULANT THERAPY.   CBC     Status: Abnormal   Collection Time: 09/03/14  4:36 AM  Result Value Ref Range   WBC 5.9 4.0 - 10.5 K/uL   RBC 4.14 (L) 4.22 - 5.81 MIL/uL   Hemoglobin 14.2 13.0 - 17.0 g/dL   HCT 16.140.3 09.639.0 - 04.552.0 %   MCV 97.3 78.0 - 100.0 fL   MCH 34.3 (H) 26.0 - 34.0 pg   MCHC 35.2 30.0 - 36.0 g/dL   RDW 40.913.4 81.111.5 - 91.415.5 %   Platelets 235 150 - 400 K/uL  Protime-INR     Status: Abnormal   Collection Time: 09/03/14  1:38 PM  Result Value Ref Range  Prothrombin Time 17.5 (H) 11.6 - 15.2 seconds   INR 1.42 0.00 - 1.49  APTT     Status: Abnormal   Collection Time: 09/03/14  5:15 PM  Result Value Ref Range   aPTT 63 (H) 24 - 37 seconds    Comment:        IF BASELINE aPTT IS ELEVATED, SUGGEST PATIENT RISK ASSESSMENT BE USED TO DETERMINE APPROPRIATE ANTICOAGULANT THERAPY.   APTT     Status: Abnormal   Collection Time: 09/03/14  9:00 PM  Result Value Ref Range   aPTT 59 (H) 24 - 37 seconds    Comment:        IF BASELINE aPTT IS ELEVATED, SUGGEST PATIENT RISK ASSESSMENT BE USED TO DETERMINE APPROPRIATE ANTICOAGULANT THERAPY.   Protime-INR     Status: Abnormal   Collection Time: 09/04/14  4:38 AM  Result Value Ref Range   Prothrombin Time 26.5 (H) 11.6 - 15.2 seconds   INR 2.42 (H) 0.00 - 1.49  APTT     Status: Abnormal   Collection Time: 09/04/14  4:38 AM  Result Value Ref Range   aPTT 64 (H) 24 - 37 seconds    Comment:        IF BASELINE aPTT IS ELEVATED, SUGGEST PATIENT RISK ASSESSMENT BE USED TO DETERMINE APPROPRIATE ANTICOAGULANT THERAPY.   CBC     Status: Abnormal   Collection Time: 09/04/14  4:38 AM  Result Value Ref Range   WBC 9.3 4.0 - 10.5 K/uL   RBC 4.18 (L) 4.22 - 5.81 MIL/uL   Hemoglobin 13.9 13.0 - 17.0 g/dL   HCT 78.2 95.6 - 21.3 %   MCV 96.9 78.0 - 100.0 fL   MCH 33.3 26.0 - 34.0 pg   MCHC 34.3 30.0 -  36.0 g/dL   RDW 08.6 57.8 - 46.9 %   Platelets 241 150 - 400 K/uL    Vitals: Blood pressure 135/81, pulse 58, temperature 97.9 F (36.6 C), temperature source Oral, resp. rate 17, height  (1.727 m), weight 87.4 kg (192 lb 10.9 oz), SpO2 100 %.  Risk to Self: Is patient at risk for suicide?: No Risk to Others:   Prior Inpatient Therapy:   Prior Outpatient Therapy:    Current Facility-Administered Medications  Medication Dose Route Frequency Provider Last Rate Last Dose  . acetaminophen (TYLENOL) tablet 650 mg  650 mg Oral Q6H PRN Maryruth Bun Rama, MD       Or  . acetaminophen (TYLENOL) suppository 650 mg  650 mg Rectal Q6H PRN Maryruth Bun Rama, MD      . alum & mag hydroxide-simeth (MAALOX/MYLANTA) 200-200-20 MG/5ML suspension 30 mL  30 mL Oral Q6H PRN Christina P Rama, MD      . atorvastatin (LIPITOR) tablet 10 mg  10 mg Oral q1800 Sahar M Osman, PA-C   10 mg at 09/03/14 1810  . cloNIDine (CATAPRES) tablet 0.2 mg  0.2 mg Oral BID Maryruth Bun Rama, MD   0.2 mg at 09/03/14 2244  . feeding supplement (ENSURE ENLIVE) (ENSURE ENLIVE) liquid 237 mL  237 mL Oral PRN Normand Sloop, RD      . FLUoxetine (PROZAC) capsule 20 mg  20 mg Oral Daily Leata Mouse, MD   20 mg at 09/03/14 1332  . folic acid (FOLVITE) tablet 1 mg  1 mg Oral Daily Maryruth Bun Rama, MD   1 mg at 09/03/14 1332  . hydrALAZINE (APRESOLINE) injection 10 mg  10 mg Intravenous Q6H PRN Ali Lowe  Claiborne Billings, NP   10 mg at 09/01/14 0519  . hydrochlorothiazide (HYDRODIURIL) tablet 25 mg  25 mg Oral Daily Maryruth Bun Rama, MD   25 mg at 09/03/14 1332  . metoprolol (LOPRESSOR) injection 5 mg  5 mg Intravenous Q4H PRN Demetrio Lapping, PA-C   5 mg at 08/30/14 2305  . metoprolol (LOPRESSOR) tablet 50 mg  50 mg Oral BID Lyn Records, MD   50 mg at 09/03/14 2243  . mirtazapine (REMERON) tablet 7.5 mg  7.5 mg Oral QHS Leata Mouse, MD   7.5 mg at 09/03/14 2242  . multivitamin with minerals tablet 1 tablet  1  tablet Oral Daily Maryruth Bun Rama, MD   1 tablet at 09/03/14 1333  . ondansetron (ZOFRAN) tablet 4 mg  4 mg Oral Q6H PRN Maryruth Bun Rama, MD       Or  . ondansetron (ZOFRAN) injection 4 mg  4 mg Intravenous Q6H PRN Maryruth Bun Rama, MD   4 mg at 08/25/14 1908  . pantoprazole (PROTONIX) EC tablet 40 mg  40 mg Oral Q0600 Maryruth Bun Rama, MD   40 mg at 09/04/14 0504  . potassium chloride SA (K-DUR,KLOR-CON) CR tablet 20 mEq  20 mEq Oral BID Cassell Clement, MD   20 mEq at 09/03/14 2242  . prednisoLONE tablet 20 mg  20 mg Oral Daily Alison Murray, MD   20 mg at 09/03/14 1333  . ramipril (ALTACE) capsule 2.5 mg  2.5 mg Oral Daily Quintella Reichert, MD      . thiamine (VITAMIN B-1) tablet 100 mg  100 mg Oral Daily Maryruth Bun Rama, MD   100 mg at 09/03/14 1332   Or  . thiamine (B-1) injection 100 mg  100 mg Intravenous Daily Maryruth Bun Rama, MD   100 mg at 08/25/14 2030  . warfarin (COUMADIN) tablet 7.5 mg  7.5 mg Oral ONCE-1800 Christine E Shade, RPH      . Warfarin - Pharmacist Dosing Inpatient   Does not apply q1800 Aleda Grana, RPH       Facility-Administered Medications Ordered in Other Encounters  Medication Dose Route Frequency Provider Last Rate Last Dose  . regadenoson (LEXISCAN) injection SOLN 0.4 mg  0.4 mg Intravenous Once Quintella Reichert, MD        Musculoskeletal: Strength & Muscle Tone: decreased Gait & Station: unable to stand Patient leans: N/A  Psychiatric Specialty Exam: Physical Exam as per history and physical   ROS depression, anxiety decreased psychomotor activity and recent status post alcohol withdrawal symptoms   Blood pressure 135/81, pulse 58, temperature 97.9 F (36.6 C), temperature source Oral, resp. rate 17, height 5\' 8"  (1.727 m), weight 87.4 kg (192 lb 10.9 oz), SpO2 100 %.Body mass index is 29.3 kg/(m^2).  General Appearance: Guarded  Eye Contact::  Good  Speech:  Clear and Coherent  Volume:  Decreased  Mood:  Anxious and Depressed  Affect:   Appropriate and Congruent  Thought Process:  Coherent and Goal Directed  Orientation:  Full (Time, Place, and Person)  Thought Content:  WDL  Suicidal Thoughts:  No  Homicidal Thoughts:  No  Memory:  Immediate;   Good Recent;   Good  Judgement:  Fair  Insight:  Lacking  Psychomotor Activity:  Decreased  Concentration:  Good  Recall:  Good  Fund of Knowledge:Good  Language: Good  Akathisia:  Negative  Handed:  Right  AIMS (if indicated):     Assets:  Communication Skills Desire  for Improvement Financial Resources/Insurance Housing Intimacy Leisure Time Resilience Social Support Talents/Skills Transportation  ADL's:  Impaired  Cognition: Impaired,  Mild  Sleep:      Medical Decision Making: New problem, with additional work up planned, Review of Psycho-Social Stressors (1), Review or order clinical lab tests (1), Established Problem, Worsening (2), New Problem, with no additional work-up planned (3), Review or order medicine tests (1), Review of Medication Regimen & Side Effects (2) and Review of New Medication or Change in Dosage (2)  Treatment Plan Summary: Daily contact with patient to assess and evaluate symptoms and progress in treatment and Medication management  Plan:  Psych social service will provide out patient resources when medically stable Continue Fluoxetine 20 mg daily for depression  Continue Remeron 7.5 mg at bedtime for insomnia Monitor for the adverse effect of the medication. Patient does not meet criteria for psychiatric inpatient admission. Supportive therapy provided about ongoing stressors. Appreciate psychiatric consultation and sign off at this time Please contact [phone] or [phone] if needs further assistance  Dispositipatient will be referred to the outpatient psychiatric services and medically stable and psychiatric consultation and psychiatric social service follow-up as needed.  Rashada Klontz,JANARDHAHA R. 09/04/2014 12:06 PM

## 2014-09-04 NOTE — Progress Notes (Signed)
Brief Update: Anticoagulation:  Pharmacy Consult for Bivalirudin, warfarin Indication: Atrial fibrillation  Assessment: [age] yoM found to be in afib with RVR.  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  S/p DCCV 4/12 on Bivalirudin bridge to warfarin.   09/04/2014, AM:  Day 8 warfarin bridged with bivalirudin.  INR again decreased to subtherapeutic level when bivalirudin was stopped. (INR 2.42 --> 1.55)  Cardiology to consider change from planned warfarin anticoagulation to DOAC.  Recommend Xarelto 20mg  once daily with supper.    Pharmacy can provide 4 weeks of NO cost either Xarelto or Eliquis to the patient.  PM:   APTT therapeutic on current rate of 0.22 mg/kg/hr (37.6 ml/hr)  No bleeding noted per RN   Goal of Therapy:  INR 2-3 aPTT 50-85 seconds Monitor platelets by anticoagulation protocol: Yes   Plan:  1) Continue bivalirudin at the current rate of 0.22 mg/kg/hr (37.6 ml/hr). 2) Check aPTT 4 hrs from 5pm - 9pm  3) Follow up anticoagulation plans   Hessie KnowsJustin M Theoplis Garciagarcia, PharmD, BCPS Pager [phone] 09/04/2014 6:00 PM

## 2014-09-04 NOTE — Evaluation (Signed)
Physical Therapy One Time Evaluation Patient Details Name: Benjamin Blackwell MRN: [id number] DOB: 12/30/1950 Today's Date: 09/04/2014   History of Present Illness  Pt is 64 y/o male with history of HTN, seizures, a-fib, and ETOH abuse. Pt admitted with alcohol withdrawal seizure. Pt underwent TEE cardioversion on 4/11.    Clinical Impression  Patient evaluated by Physical Therapy with no further acute PT needs identified. All education has been completed and the patient has no further questions. Pt HR at rest was in the 60s and elevated to 81 bpm during ambulation, remaining in sinus rhythm per telemetry. See below for any follow-up Physical Therapy or equipment needs. PT is signing off. Thank you for this referral.     Follow Up Recommendations No PT follow up    Equipment Recommendations  None recommended by PT    Recommendations for Other Services       Precautions / Restrictions Precautions Precautions: Fall Restrictions Weight Bearing Restrictions: No      Mobility  Bed Mobility Overal bed mobility: Independent                Transfers Overall transfer level: Independent Equipment used: None                Ambulation/Gait Ambulation/Gait assistance: Modified independent (Device/Increase time);Supervision Ambulation Distance (Feet): 360 Feet Assistive device: None Gait Pattern/deviations: WFL(Within Functional Limits)     General Gait Details: pt drifted a little during ambulation, probably due to min walking during hospital stay. gait improved with distance.   Stairs            Wheelchair Mobility    Modified Rankin (Stroke Patients Only)       Balance Overall balance assessment: Modified Independent         Standing balance support: No upper extremity supported Standing balance-Leahy Scale: Good                               Pertinent Vitals/Pain Pain Assessment: No/denies pain    Home Living Family/patient expects  to be discharged to:: Private residence Living Arrangements: Spouse/significant other     Home Access: Ramped entrance     Home Layout: One level        Prior Function Level of Independence: Independent               Hand Dominance        Extremity/Trunk Assessment               Lower Extremity Assessment: Overall WFL for tasks assessed         Communication   Communication: No difficulties  Cognition Arousal/Alertness: Awake/alert Behavior During Therapy: WFL for tasks assessed/performed Overall Cognitive Status: Within Functional Limits for tasks assessed                      General Comments      Exercises        Assessment/Plan    PT Assessment Patent does not need any further PT services  PT Diagnosis     PT Problem List    PT Treatment Interventions     PT Goals (Current goals can be found in the Care Plan section) Acute Rehab PT Goals PT Goal Formulation: All assessment and education complete, DC therapy    Frequency     Barriers to discharge        Co-evaluation  End of Session   Activity Tolerance: Patient tolerated treatment well Patient left: in chair;with call bell/phone within reach Nurse Communication: Mobility status         Time: 0917-0928 PT Time Calculation (min) (ACUTE ONLY): 11 min   Charges:   PT Evaluation $Initial PT Evaluation Tier I: 1 Procedure     PT G Codes:        Benjamin Blackwell 09/04/2014, 1:53 PM Netty Starring, SPT

## 2014-09-04 NOTE — Progress Notes (Signed)
Brief Update: Anticoagulation:  Pharmacy Consult for Bivalirudin, warfarin Indication: Atrial fibrillation  Assessment: [age] yoM found to be in afib with RVR.  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  S/p DCCV 4/12 on Bivalirudin bridge to warfarin.   09/04/2014:  Day 8 warfarin bridged with bivalirudin.  INR again decreased to subtherapeutic level when bivalirudin was stopped. (INR 2.42 --> 1.55)  Cardiology to consider change from planned warfarin anticoagulation to DOAC.  Recommend Xarelto 20mg  once daily with supper.    Pharmacy can provide 4 weeks of NO cost either Xarelto or Eliquis to the patient.  Goal of Therapy:  INR 2-3 aPTT 50-85 seconds Monitor platelets by anticoagulation protocol: Yes   Plan:   Resume bivalirudin at the previous rate of 0.22 mg/kg/hr (37.6 ml/hr).  Check aPTT at 2 hours after initiation then q4h until therapeutic x2 then daily.   Warfarin 10 mg PO x1 tonight   Larger warfarin dose tonight with goal INR closer to 3 before stopping bivalirudin to recheck INR  Follow up anticoagulation plans.  Lynann Beaverhristine Kamiryn Bezanson PharmD, BCPS Pager [phone] 09/04/2014 1:15 PM

## 2014-09-04 NOTE — Progress Notes (Addendum)
Patient ID: Benjamin Blackwell, male   DOB: 08-12-1950, 64 y.o.   MRN: [id number] [facility] PROGRESS NOTE  Benjamin Blackwell OZH:[id number] DOB: 07-10-1950 DOA: 08/25/2014 PCP: No primary care provider on file.  Brief narrative:    64 y.o. male with past medical history of alcohol abuse and alcohol dependence who presented to [facility] hospital with alcohol related withdrawal seizures. Of note, he has completed detox therapy for alcohol dependency.  Hospital course was complicated with finding of atrial fibrillation with RVR. Cardiology has seen the patient in consultation and pt underwent TEE cardioversion 09/02/2014 and requirement is to continue 6 weeks of Coumadin therapy on discharge.   He underwent stress test for risk stratification. We are awaiting therapeutic INR. Not yet stable for discharge.   Assessment/Plan:    Principal Problem: Alcohol related withdrawal seizures / history of alcohol abuse and alcohol dependence - Patient was on CIWA protocol on admission. Completed detox. - No withdrawals or seizures  - We will continue folic acid, multivitamin and thiamine.  Active Problems: Hypokalemia - Electrolyte depletion secondary to alcohol abuse, Hctz. - Potassium supplemented on daily basis   Metabolic acidosis - Secondary to alcohol abuse.  - Resolved.  Atrial fibrillation/flutter with RVR /  - CHADS vasc score 1 - Underwent TEE cardioversion 09/02/2014. - Rate is controlled with metoprolol.  - Angiomax drip to bridge with coumadin - INR subtherapeutic. - 2-D echo on this admission with normal ejection fraction.  ST elevations in inferior leads / Troponin elevation - ST elevation in inferior leads on 12-lead EKG in addition to mild elevation in troponin level likely due to demand ischemia from A fib. - AC with coumadin for 6 weeks. - Has has stress test 09/03/2014.   Essential hypertension - Continue clonidine 0.2 mg twice daily, HCTZ 25 mg daily, metoprolol 50  mg twice daily.   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 - LFTs improved with steroids. Stop prednisone today.    Dyslipidemia - Continue Lipitor 10 mg daily.   Hyperglycemia/prediabetes - Hemoglobin A1c 5.8%. - Pt counseled on diet.    Depression - Continue Prozac.   Thrombocytopenia - Due to bone marrow suppression from alcohol abuse.  - Resolved.    DVT Prophylaxis  - On full dose anticoagulation with Coumadin.  - On angiomax drip to bridge with coumadin since INR sub therapeutic     Code Status: Full.  Family Communication:  No family at the bedside.  Disposition Plan: INR sub therapeutic so not yet stable for discharge.  IV access:  Peripheral IV  Procedures and diagnostic studies:    Ct Head Wo Contrast 08/25/2014: 1. 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 No significant mass effect to suggest acute or subacute process. 2. Cerebral atrophy and small vessel ischemic change.   Dg Chest Port 1 View 08/25/2014: Moderate cardiomegaly with central vascular congestion but no focal acute finding.   2-D echo 08/27/14:  Medical Consultants:  Cardiology  Other Consultants:  None   IAnti-Infectives:   None    [name], MD  [facility] Pager [phone]  If 7PM-7AM, please contact night-coverage [email or web address] Password [email or web address] 09/04/2014, 3:24 PM   LOS: 10 days    Time spent in minutes: 15 minutes   HPI/Subjective: No acute overnight events. Patient reports feeling better this am but does have mild stomach ache.  Objective: Filed Vitals:   09/03/14 2041 09/03/14 2243 09/04/14 0507 09/04/14 0906  BP: 146/78 130/76 135/81  133/91  Pulse: 69 78 58 65  Temp: 98.2 F (36.8 C)  97.9 F (36.6 C)   TempSrc: Oral  Oral   Resp: 18  17   Height:      Weight:      SpO2: 100%  100%     Intake/Output Summary (Last 24 hours) at 09/04/14 1524 Last data filed at  09/04/14 0900  Gross per 24 hour  Intake 2521.65 ml  Output   2075 ml  Net 446.65 ml    Exam:   General:  Pt is awake, no distress  Cardiovascular: Rhythm regular, S1, S2 appreciated   Respiratory: bilateral air entry, no wheezing  Abdomen: non tender abdomen, non distended, (+) BS  Extremities: No leg swelling, pulses palpable bilaterally  Neuro: Nonfocal  Data Reviewed: Basic Metabolic Panel:  Recent Labs Lab 08/29/14 0446 09/01/14 0455 09/02/14 0445  NA 138 139 141  K 3.6 3.4* 3.8  CL 102 103 106  CO2 25 26 26   GLUCOSE 148* 141* 144*  BUN 19 21 21   CREATININE 1.08 1.06 1.16  CALCIUM 9.3 9.3 9.3   Liver Function Tests:  Recent Labs Lab 08/29/14 0446 09/01/14 0455  AST 203* 98*  ALT 381* 240*  ALKPHOS 62 59  BILITOT 0.8 0.7  PROT 7.8 7.7  ALBUMIN 3.7 3.3*   No results for input(s): LIPASE, AMYLASE in the last 168 hours. No results for input(s): AMMONIA in the last 168 hours. CBC:  Recent Labs Lab 08/30/14 0047 08/31/14 0548 09/01/14 0455 09/03/14 0436 09/04/14 0438  WBC 5.8 8.0 6.7 5.9 9.3  HGB 15.0 16.1 15.5 14.2 13.9  HCT 41.9 45.9 44.0 40.3 40.5  MCV 95.2 95.6 95.7 97.3 96.9  PLT 174 224 211 235 241   Cardiac Enzymes: No results for input(s): CKTOTAL, CKMB, CKMBINDEX, TROPONINI in the last 168 hours. BNP: Invalid input(s): POCBNP CBG: No results for input(s): GLUCAP in the last 168 hours.  Recent Results (from the past 240 hour(s))  MRSA PCR Screening     Status: None   Collection Time: 08/25/14  6:40 PM  Result Value Ref Range Status   MRSA by PCR NEGATIVE NEGATIVE Final     Scheduled Meds: . atorvastatin  10 mg Oral q1800  . cloNIDine  0.2 mg Oral BID  . FLUoxetine  20 mg Oral Daily  . folic acid  1 mg Oral Daily  . hydrochlorothiazide  25 mg Oral Daily  . metoprolol tartrate  50 mg Oral BID  . mirtazapine  7.5 mg Oral QHS  . multivitamin with minerals  1 tablet Oral Daily  . pantoprazole  40 mg Oral Q0600  . potassium  chloride  20 mEq Oral BID  . prednisoLONE  30 mg Oral Daily  . thiamine  100 mg Oral Daily   Or  . thiamine  100 mg Intravenous Daily  . warfarin  6 mg Oral ONCE-1800   Continuous Infusions: . bivalirudin (ANGIOMAX) infusion 0.5 mg/mL (Non-ACS indications) 0.22 mg/kg/hr (09/04/14 1458)

## 2014-09-04 NOTE — Progress Notes (Signed)
ANTICOAGULATION CONSULT NOTE - Follow Up Consult  Pharmacy Consult for Bivalirudin, warfarin Indication: Atrial fibrillation  Allergies  Allergen Reactions  . Beef-Derived Products Hives  . Pork-Derived Products Hives    Patient Measurements: Height: 5\' 8"  (172.7 cm) Weight: 192 lb 10.9 oz (87.4 kg) IBW/kg (Calculated) : 68.4  Vital Signs: Temp: 97.9 F (36.6 C) (04/13 0507) Temp Source: Oral (04/13 0507) BP: 135/81 mmHg (04/13 0507) Pulse Rate: 58 (04/13 0507)  Labs:  Recent Labs  09/02/14 0445 09/03/14 0436 09/03/14 1338 09/03/14 1715 09/03/14 2100 09/04/14 0438  HGB  --  14.2  --   --   --  13.9  HCT  --  40.3  --   --   --  40.5  PLT  --  235  --   --   --  241  APTT 55* 59*  --  63* 59* 64*  LABPROT 22.6* 25.6* 17.5*  --   --  26.5*  INR 1.97* 2.31* 1.42  --   --  2.42*  CREATININE 1.16  --   --   --   --   --     Estimated Creatinine Clearance: 71 mL/min (by C-G formula based on Cr of 1.16).  Medications:  Infusions:  . bivalirudin (ANGIOMAX) infusion 0.5 mg/mL (Non-ACS indications) 0.22 mg/kg/hr (09/03/14 1719)    Assessment: [age] yoM admitted with alcohol withdrawal seizure found to be in afib with RVR and elevated troponins.  Bivalirudin started 4/4 for r/o ACS since patient has pork allergy and liver dysfunction.  Warfarin initiated 4/6 but anticoagulation was subsequently d/c'd  same day d/t concern of compliance and alcohol abuse. On 4/7 cardiology decided to pursue DCCV so recommend resume Bivalirudin and warfarin. ASA was stopped.  Today, 09/04/2014:   Day 8 warfarin bridged with bivalirudin  INR 2.42, therapeutic   aPTT 64, therapeutic on bivalirudin at 37.6 ml/hr  CBC wnl.  Previous Thrombocytopenia suspected d/t h/o ETOH abuse.  AST/ALT improving, 98/240 (4/10)  No bleeding/complications reported.  Potential drug interactions with warfarin: steroids (can increase anticoagulant effect)  Diet: Regular, eating 75-100% of meals  Goal of  Therapy:  INR 2-3 aPTT 50-85 seconds Monitor platelets by anticoagulation protocol: Yes   Plan:   D/C Bivalirudin  Recheck INR 4 hors after bivalirudin turned off.    If INR < 2, resume bivalirudin at the previous rate of 0.22 mg/kg/hr (37.6 ml/hr).    Warfarin 7.5 mg PO x1 tonight   Watch INR trend closely d/t liver dysfunction and EtOH abuse.    Daily INR, aPTT, CBC  Patient has received warfarin education  Lynann BeaverChristine Rahn Lacuesta PharmD, BCPS Pager [phone] 09/04/2014 7:09 AM

## 2014-09-04 NOTE — Progress Notes (Signed)
SUBJECTIVE:  No complaints  OBJECTIVE:   Vitals:   Filed Vitals:   09/03/14 1800 09/03/14 2041 09/03/14 2243 09/04/14 0507  BP: 144/86 146/78 130/76 135/81  Pulse: 63 69 78 58  Temp:  98.2 F (36.8 C)  97.9 F (36.6 C)  TempSrc:  Oral  Oral  Resp:  18  17  Height:      Weight:      SpO2:  100%  100%   I&O's:   Intake/Output Summary (Last 24 hours) at 09/04/14 0815 Last data filed at 09/04/14 0745  Gross per 24 hour  Intake 2559.25 ml  Output   2075 ml  Net 484.25 ml   TELEMETRY: Reviewed telemetry pt in NSR:     PHYSICAL EXAM General: Well developed, well nourished, in no acute distress Head: Eyes PERRLA, No xanthomas.   Normal cephalic and atramatic  Lungs:   Clear bilaterally to auscultation and percussion. Heart:   HRRR S1 S2 Pulses are 2+ & equal. Abdomen: Bowel sounds are positive, abdomen soft and non-tender without masses  Extremities:   No clubbing, cyanosis or edema.  DP +1 Neuro: Alert and oriented X 3. Psych:  Good affect, responds appropriately   LABS: Basic Metabolic Panel:  Recent Labs  16/02/9603/11/16 0445  NA 141  K 3.8  CL 106  CO2 26  GLUCOSE 144*  BUN 21  CREATININE 1.16  CALCIUM 9.3   Liver Function Tests: No results for input(s): AST, ALT, ALKPHOS, BILITOT, PROT, ALBUMIN in the last 72 hours. No results for input(s): LIPASE, AMYLASE in the last 72 hours. CBC:  Recent Labs  09/03/14 0436 09/04/14 0438  WBC 5.9 9.3  HGB 14.2 13.9  HCT 40.3 40.5  MCV 97.3 96.9  PLT 235 241   Cardiac Enzymes: No results for input(s): CKTOTAL, CKMB, CKMBINDEX, TROPONINI in the last 72 hours. BNP: Invalid input(s): POCBNP D-Dimer: No results for input(s): DDIMER in the last 72 hours. Hemoglobin A1C: No results for input(s): HGBA1C in the last 72 hours. Fasting Lipid Panel: No results for input(s): CHOL, HDL, LDLCALC, TRIG, CHOLHDL, LDLDIRECT in the last 72 hours. Thyroid Function Tests: No results for input(s): TSH, T4TOTAL, T3FREE,  THYROIDAB in the last 72 hours.  Invalid input(s): FREET3 Anemia Panel: No results for input(s): VITAMINB12, FOLATE, FERRITIN, TIBC, IRON, RETICCTPCT in the last 72 hours. Coag Panel:   Lab Results  Component Value Date   INR 2.42* 09/04/2014   INR 1.42 09/03/2014   INR 2.31* 09/03/2014    RADIOLOGY: Ct Head Wo Contrast  08/25/2014   CLINICAL DATA:  Ethanol abuse and seizure.  EXAM: CT HEAD WITHOUT CONTRAST  TECHNIQUE: Contiguous axial images were obtained from the base of the skull through the vertex without intravenous contrast.  COMPARISON:  None.  FINDINGS: Sinuses/Soft tissues: Clear paranasal sinuses and mastoid air cells.  Intracranial: Age advanced cerebral atrophy. Left occipital lobe hypoattenuation is most consistent with remote infarct. Mild low density in the periventricular white matter likely related to small vessel disease. No mass lesion, hemorrhage, hydrocephalus, acute infarct, intra-axial, or extra-axial fluid collection.  IMPRESSION: 1. 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 No significant mass effect to suggest acute or subacute process. 2.  Cerebral atrophy and small vessel ischemic change.   Electronically Signed   By: Jeronimo GreavesKyle  Talbot M.D.   On: 08/25/2014 17:07   Dg Chest Port 1 View  08/25/2014   CLINICAL DATA:  Shortness of breath  EXAM: PORTABLE CHEST - 1  VIEW  COMPARISON:  None.  FINDINGS: The heart size is moderately enlarged without evidence for edema. Cardiac leads obscure detail. Both lungs are clear. The visualized skeletal structures are unremarkable.  IMPRESSION: Moderate cardiomegaly with central vascular congestion but no focal acute finding.   Electronically Signed   By: Christiana Pellant M.D.   On: 08/25/2014 15:48   ASSESSMENT AND PLAN  1. Paroxysmal atrial flutter with controlled variable ventricular response. Now s/p TEE/DCCV to NSR. Maintaining NSR on metoprolol.  2. Alcohol abuse 3. Pork allergy requiring  bivalirudin as anticoagulant. Patient states anaphylaxis with pork. Will need chronic anticoagulation for 6 weeks. INR therapeutic so ok to stop Bivalirudin and recheck INR 4 hours later. 4. Abnormal LFTs improving off alcohol 5. Mild hypokalemia.Repleted 6. HTN - Controlled on clonidine/HCTZ and BB 7. Elevated Troponin - mildly elevated with flat trend and normal LVF on echo. Most likely demand ischemia in setting of rapid afib/flutter. Nuclear stress test high risk only due to reduced LVF which is most likely secondary to tachycrdia induced DCM.  Would recheck 2D echo in 2 months and if EF has not improved then consider cath.   8.  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  Continue BB.  Add Altace 2.5mg  daily.    OK to d/c home if INR therapeutic 4 hours off Bivalirudin.  Followup with Dr. Clifton James as outpt in 2-3 weeks.  Followup in coumadin clinic on Friday in our office   Quintella Reichert, MD  09/04/2014  8:15 AM

## 2014-09-04 NOTE — Progress Notes (Signed)
CSW met with Pt along with psych MD to assess for further needs. Pt reports that he does not have any current needs at this time and is planning to follow-up on his own in his home town. Psych MD reports that Pt is cleared psychiatrically. CSW will provide Pt with list of AA meetings in [place] signing off but available as needs arise.  Peri Maris, [place] 09/04/2014 1:04 PM [phone]

## 2014-09-05 ENCOUNTER — Inpatient Hospital Stay: Payer: Self-pay | Admitting: Family Medicine

## 2014-09-05 DIAGNOSIS — F1020 Alcohol dependence, uncomplicated: Secondary | ICD-10-CM

## 2014-09-05 LAB — PROTIME-INR
INR: 2.43 — AB (ref 0.00–1.49)
INR: 1.61 — ABNORMAL HIGH (ref 0.00–1.49)
PROTHROMBIN TIME: 26.6 s — AB (ref 11.6–15.2)
Prothrombin Time: 19.3 seconds — ABNORMAL HIGH (ref 11.6–15.2)

## 2014-09-05 LAB — APTT
APTT: 68 s — AB (ref 24–37)
APTT: 61 s — AB (ref 24–37)

## 2014-09-05 LAB — CBC
HCT: 40.4 % (ref 39.0–52.0)
Hemoglobin: 14 g/dL (ref 13.0–17.0)
MCH: 33.2 pg (ref 26.0–34.0)
MCHC: 34.7 g/dL (ref 30.0–36.0)
MCV: 95.7 fL (ref 78.0–100.0)
PLATELETS: 233 10*3/uL (ref 150–400)
RBC: 4.22 MIL/uL (ref 4.22–5.81)
RDW: 12.9 % (ref 11.5–15.5)
WBC: 6.7 10*3/uL (ref 4.0–10.5)

## 2014-09-05 MED ORDER — SODIUM CHLORIDE 0.9 % IV SOLN
0.2200 mg/kg/h | INTRAVENOUS | Status: DC
  Administered 2014-09-05 – 2014-09-06 (×2): 0.22 mg/kg/h via INTRAVENOUS
  Filled 2014-09-05 (×2): qty 250

## 2014-09-05 MED ORDER — WARFARIN SODIUM 2.5 MG PO TABS
12.5000 mg | ORAL_TABLET | Freq: Once | ORAL | Status: AC
  Administered 2014-09-05: 12.5 mg via ORAL
  Filled 2014-09-05: qty 1
  Filled 2014-09-05: qty 2

## 2014-09-05 MED ORDER — WARFARIN SODIUM 5 MG PO TABS: 10.0000 mg | ORAL_TABLET | Freq: Once | ORAL | Status: DC

## 2014-09-05 NOTE — Progress Notes (Signed)
ANTICOAGULATION CONSULT NOTE - Follow Up Consult  Pharmacy Consult for Bivalirudin, warfarin Indication: Atrial fibrillation  Allergies  Allergen Reactions  . Beef-Derived Products Hives  . Pork-Derived Products Hives    Patient Measurements: Height: 5\' 8"  (172.7 cm) Weight: 192 lb 10.9 oz (87.4 kg) IBW/kg (Calculated) : 68.4  Vital Signs: Temp: 97.4 F (36.3 C) (04/14 0557) Temp Source: Oral (04/14 0557) BP: 130/81 mmHg (04/14 0557) Pulse Rate: 44 (04/14 0557)  Labs:  Recent Labs  09/03/14 0436  09/04/14 0438 09/04/14 1148 09/04/14 1650 09/04/14 2202 09/05/14 0518 09/05/14 1050  HGB 14.2  --  13.9  --   --   --  14.0  --   HCT 40.3  --  40.5  --   --   --  40.4  --   PLT 235  --  241  --   --   --  233  --   APTT 59*  < > 64*  --  62* 63* 68*  --   LABPROT 25.6*  < > 26.5* 18.8*  --   --  26.6* 19.3*  INR 2.31*  < > 2.42* 1.55*  --   --  2.43* 1.61*  < > = values in this interval not displayed.  Estimated Creatinine Clearance: 71 mL/min (by C-G formula based on Cr of 1.16).  Medications:  Infusions:  . bivalirudin (ANGIOMAX) infusion 0.5 mg/mL (Non-ACS indications)      Assessment: [age] yoM found to be in afib with RVR. 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 S/p DCCV 4/12 on Bivalirudin bridge to warfarin.    INR 2.43, therapeutic on bivalirudin this morning.  Decreased to subtherapeutic INR 1.61 off bivalirudin.  Goal of Therapy:  INR 2-3 aPTT 50-85 seconds Monitor platelets by anticoagulation protocol: Yes   Plan:   Resume bivalirudin at the previous rate of 0.22 mg/kg/hr (37.6 ml/hr).  Check aPTT at 2 hours after initiation then q4h until therapeutic x2 then daily.   Warfarin 12.5 mg PO x1 tonight  Larger warfarin dose tonight with goal INR closer to 3 before stopping bivalirudin to recheck INR   Lynann Beaverhristine Daneshia Tavano PharmD, BCPS Pager [phone] 09/05/2014 11:58 AM

## 2014-09-05 NOTE — Progress Notes (Signed)
Stopped Angiomax as directed by Pharmacy. SRP, RN

## 2014-09-05 NOTE — Progress Notes (Signed)
SUBJECTIVE:  No complaints  OBJECTIVE:   Vitals:   Filed Vitals:   09/04/14 0906 09/04/14 1600 09/04/14 2130 09/05/14 0557  BP: 133/91 129/76 133/77 130/81  Pulse: 65 66 51 44  Temp:  97.9 F (36.6 C) 97.9 F (36.6 C) 97.4 F (36.3 C)  TempSrc:  Oral Oral Oral  Resp:  20 20 20   Height:      Weight:      SpO2:  98% 100% 100%   I&O's:   Intake/Output Summary (Last 24 hours) at 09/05/14 0753 Last data filed at 09/05/14 0055  Gross per 24 hour  Intake    360 ml  Output   1000 ml  Net   -640 ml   TELEMETRY: Reviewed telemetry pt in Sinus brady to NSR:     PHYSICAL EXAM General: Well developed, well nourished, in no acute distress Head: Eyes PERRLA, No xanthomas.   Normal cephalic and atramatic  Lungs:   Clear bilaterally to auscultation and percussion. Heart:   HRRR S1 S2 Pulses are 2+ & equal. Abdomen: Bowel sounds are positive, abdomen soft and non-tender without masses Extremities:   No clubbing, cyanosis or edema.  DP +1 Neuro: Alert and oriented X 3. Psych:  Good affect, responds appropriately   LABS: Basic Metabolic Panel: No results for input(s): NA, K, CL, CO2, GLUCOSE, BUN, CREATININE, CALCIUM, MG, PHOS in the last 72 hours. Liver Function Tests: No results for input(s): AST, ALT, ALKPHOS, BILITOT, PROT, ALBUMIN in the last 72 hours. No results for input(s): LIPASE, AMYLASE in the last 72 hours. CBC:  Recent Labs  09/04/14 0438 09/05/14 0518  WBC 9.3 6.7  HGB 13.9 14.0  HCT 40.5 40.4  MCV 96.9 95.7  PLT 241 233   Cardiac Enzymes: No results for input(s): CKTOTAL, CKMB, CKMBINDEX, TROPONINI in the last 72 hours. BNP: Invalid input(s): POCBNP D-Dimer: No results for input(s): DDIMER in the last 72 hours. Hemoglobin A1C: No results for input(s): HGBA1C in the last 72 hours. Fasting Lipid Panel: No results for input(s): CHOL, HDL, LDLCALC, TRIG, CHOLHDL, LDLDIRECT in the last 72 hours. Thyroid Function Tests: No results for input(s): TSH,  T4TOTAL, T3FREE, THYROIDAB in the last 72 hours.  Invalid input(s): FREET3 Anemia Panel: No results for input(s): VITAMINB12, FOLATE, FERRITIN, TIBC, IRON, RETICCTPCT in the last 72 hours. Coag Panel:   Lab Results  Component Value Date   INR 2.43* 09/05/2014   INR 1.55* 09/04/2014   INR 2.42* 09/04/2014    RADIOLOGY: Ct Head Wo Contrast  08/25/2014   CLINICAL DATA:  Ethanol abuse and seizure.  EXAM: CT HEAD WITHOUT CONTRAST  TECHNIQUE: Contiguous axial images were obtained from the base of the skull through the vertex without intravenous contrast.  COMPARISON:  None.  FINDINGS: Sinuses/Soft tissues: Clear paranasal sinuses and mastoid air cells.  Intracranial: Age advanced cerebral atrophy. Left occipital lobe hypoattenuation is most consistent with remote infarct. Mild low density in the periventricular white matter likely related to small vessel disease. No mass lesion, hemorrhage, hydrocephalus, acute infarct, intra-axial, or extra-axial fluid collection.  IMPRESSION: 1. 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 No significant mass effect to suggest acute or subacute process. 2.  Cerebral atrophy and small vessel ischemic change.   Electronically Signed   By: Jeronimo GreavesKyle  Talbot M.D.   On: 08/25/2014 17:07   Nm Myocar Multi W/spect W/wall Motion / Ef  09/04/2014   CLINICAL DATA:  Chest pain  EXAM: Lexiscan Myovue  TECHNIQUE: The  patient received IV Lexiscan .  over 15 seconds. 33.0 mCi of Technetium 48m Sestamibi injected at 30 seconds. Quantitative SPECT images were obtained in the vertical, horizontal and short axis planes after a 45 minute delay. Rest images were obtained with similar planes and delay using 10.2 mCi of Technetium 43m Sestamibi.  FINDINGS: ECG:  No ECG changes with Lexiscan infusion.  Quantitiative Gated SPECT EF: 10% with diffuse hypokinesis.  Perfusion Images: There was a medium-sized, severe basal to mid inferior and inferolateral perfusion  defect at both rest and with stress.  IMPRESSION: High risk study due to low EF. Fixed severe basal to mid inferior and inferolateral defect suggests prior MI, no evidence for significant ischemia. The degree of depression of ejection fraction is not totally explained by the defect.  Dalton Mclean   Electronically Signed   By: Marca Ancona M.D.   On: 09/04/2014 17:19   Dg Chest Port 1 View  08/25/2014   CLINICAL DATA:  Shortness of breath  EXAM: PORTABLE CHEST - 1 VIEW  COMPARISON:  None.  FINDINGS: The heart size is moderately enlarged without evidence for edema. Cardiac leads obscure detail. Both lungs are clear. The visualized skeletal structures are unremarkable.  IMPRESSION: Moderate cardiomegaly with central vascular congestion but no focal acute finding.   Electronically Signed   By: Christiana Pellant M.D.   On: 08/25/2014 15:48    ASSESSMENT AND PLAN  1. Paroxysmal atrial flutter with controlled variable ventricular response. Now s/p TEE/DCCV to NSR. Maintaining NSR on metoprolol. Will continue to load Warfarin.  Would prefer to continue on this route instead of changing to NOAC given he was just cardioverted and there are no guidelines established yet for safety of changing to NOAC right after DCCV in regards to risk of stroke. 2. Alcohol abuse 3. Pork allergy requiring bivalirudin as anticoagulant. Patient states anaphylaxis with pork. Will need chronic anticoagulation for 6 weeks. INR therapeutic.  Will stop Bivalirudin and recheck INR 4 hours later. 4. Abnormal LFTs improving off alcohol 5. Mild hypokalemia.Repleted 6. HTN - Controlled on clonidine/HCTZ and BB 7. Elevated Troponin - mildly elevated with flat trend and normal LVF on echo. Most likely demand ischemia in setting of rapid afib/flutter. Nuclear stress test high risk only due to reduced LVF which is most likely secondary to tachycrdia induced DCM. Would recheck 2D echo in 2 months and if EF has not improved then  consider cath.  8. 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 Continue BB. Continue ACE I and BB    Quintella Reichert, MD  09/05/2014  7:53 AM

## 2014-09-05 NOTE — Progress Notes (Addendum)
Patient ID: Janis Mahajan, male   DOB: 07/17/1950, 64 y.o.   MRN: [id number] [facility]egal[facility]  Albertina Senegalrvin Smigiel HQI:[id number] DOB: 02/23/1951 DOA: 08/25/2014 PCP: No primary care provider on file.  Brief narrative:    64 y.o. male with past medical history of alcohol abuse and alcohol dependence who [facility] with alcohol related withdrawal seizures. Of note, he has completed detox therapy for alcohol dependency.  Hospital course was complicated with finding of atrial fibrillation with RVR. Cardiology has seen the patient in consultation and pt underwent TEE cardioversion 09/02/2014 and requirement is to continue 6 weeks of Coumadin therapy on discharge.   He underwent stress test for risk stratification. On coumadin and angiomax drip, needs 2 consecutive INR 2-3 for safe discharge.   Assessment/Plan:    Principal Problem: Alcohol related withdrawal seizures / history of alcohol abuse and alcohol dependence - Patient was on CIWA protocol on admission. Completed detox. - We spoke extensively about compliance with coumadin, not drinking. He knows he needs coumadin for 6 weeks after TEE. - No reports of withdrawals or seizures  - Continue folic acid, multivitamin and thiamine.  Active Problems: Hypokalemia - Secondary to alcohol abuse, Hctz. - Potassium repleted  Metabolic acidosis - Secondary to alcohol abuse.  - Resolved.  Atrial fibrillation/flutter with RVR /  - CHADS vasc score 1 - Underwent TEE cardioversion 09/02/2014. - Continue metoprolol for rate control. - Continue angiomax drip to bridge with coumadin - 2-D echo on this admission with normal ejection fraction.  ST elevations in inferior leads / Troponin elevation - ST elevation noted in inferior leads on 12-lead EKG in addition to mild elevation in troponin level likely due to demand ischemia from A fib. - AC with coumadin for 6 weeks after TEE. - Stress test 09/03/2014 - Fixed  severe basal to mid inferior and inferolateral defect suggests prior MI, no evidence for significant ischemia.    Essential hypertension - We will continue clonidine 0.2 mg twice daily, HCTZ 25 mg daily, metoprolol 50 mg twice daily.   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 - LFTs improved with steroids. Steroids stopped 09/04/14.    Dyslipidemia - Continue Lipitor 10 mg daily.   Hyperglycemia/prediabetes - Hemoglobin A1c 5.8%. - Counseled on diet.    Depression - Continue Prozac. Stable.   Thrombocytopenia - Due to bone marrow suppression from alcohol abuse.  - Platelets now WNL.   DVT Prophylaxis  - On full dose anticoagulation with Coumadin.  - On angiomax drip to bridge with coumadin, needs 2 consecutive INR 2-3 range    Code Status: Full.  Family Communication:  No family at the bedside.  Disposition Plan: INR sub therapeutic; D/C once INR 2-3 two consecutive times  IV access:  Peripheral IV  Procedures and diagnostic studies:    Ct Head Wo Contrast 08/25/2014: 1. 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 No significant mass effect to suggest acute or subacute process. 2. Cerebral atrophy and small vessel ischemic change.   Dg Chest Port 1 View 08/25/2014: Moderate cardiomegaly with central vascular congestion but no focal acute finding.   2-D echo 08/27/14  Stress test 09/03/2014   Medical Consultants:  Cardiology  Other Consultants:  None   IAnti-Infectives:   None    Manson PasseyEVINE, Makhya Arave, MD  [facility] Pager [phone]  If 7PM-7AM, please contact night-coverage [email or web address] Password [email or web address] 09/05/2014, 1:13 PM   LOS: 11 days  Time spent in minutes: 15 minutes   HPI/Subjective: No acute overnight events. Patient reports no cough, no lightheadedness. No bleeding.  Objective: Filed Vitals:   09/04/14 1600 09/04/14 2130 09/05/14 0557 09/05/14 1238  BP:  129/76 133/77 130/81 135/81  Pulse: 66 51 44 49  Temp: 97.9 F (36.6 C) 97.9 F (36.6 C) 97.4 F (36.3 C)   TempSrc: Oral Oral Oral   Resp: Height:      Weight:      SpO2: 98% 100% 100%     Intake/Output Summary (Last 24 hours) at 09/05/14 1313 Last data filed at 09/05/14 0915  Gross per 24 hour  Intake      0 ml  Output   1400 ml  Net  -1400 ml    Exam:   General:  Pt is sleeping, wakes up easily  Cardiovascular: S1, S2 appreciated, rate controlled  Respiratory: clear to auscultations bilaterally, no wheezing  Abdomen: soft, non tender, (+) BS  Extremities: palpable pulses, no leg swelling  Neuro: No focal deficits   Data Reviewed: Basic Metabolic Panel:  Recent Labs Lab 09/01/14 0455 09/02/14 0445  NA 139 141  K 3.4* 3.8  CL 103 106  CO2 26 26  GLUCOSE 141* 144*  BUN 21 21  CREATININE 1.06 1.16  CALCIUM 9.3 9.3   Liver Function Tests:  Recent Labs Lab 09/01/14 0455  AST 98*  ALT 240*  ALKPHOS 59  BILITOT 0.7  PROT 7.7  ALBUMIN 3.3*   No results for input(s): LIPASE, AMYLASE in the last 168 hours. No results for input(s): AMMONIA in the last 168 hours. CBC:  Recent Labs Lab 08/31/14 0548 09/01/14 0455 09/03/14 0436 09/04/14 0438 09/05/14 0518  WBC 8.0 6.7 5.9 9.3 6.7  HGB 16.1 15.5 14.2 13.9 14.0  HCT 45.9 44.0 40.3 40.5 40.4  MCV 95.6 95.7 97.3 96.9 95.7  PLT 224 211 235 241 233   Cardiac Enzymes: No results for input(s): CKTOTAL, CKMB, CKMBINDEX, TROPONINI in the last 168 hours. BNP: Invalid input(s): POCBNP CBG: No results for input(s): GLUCAP in the last 168 hours.  Recent Results (from the past 240 hour(s))  MRSA PCR Screening     Status: None   Collection Time: 08/25/14  6:40 PM  Result Value Ref Range Status   MRSA by PCR NEGATIVE NEGATIVE Final     Scheduled Meds: . atorvastatin  10 mg Oral q1800  . cloNIDine  0.2 mg Oral BID  . FLUoxetine  20 mg Oral Daily  . folic acid  1 mg Oral Daily  .  hydrochlorothiazide  25 mg Oral Daily  . metoprolol tartrate  50 mg Oral BID  . mirtazapine  7.5 mg Oral QHS  . multivitamin with minerals  1 tablet Oral Daily  . pantoprazole  40 mg Oral Q0600  . potassium chloride  20 mEq Oral BID  . prednisoLONE  30 mg Oral Daily  . thiamine  100 mg Oral Daily   Or  . thiamine  100 mg Intravenous Daily  . warfarin  6 mg Oral ONCE-1800   Continuous Infusions: . bivalirudin (ANGIOMAX) infusion 0.5 mg/mL (Non-ACS indications) 0.22 mg/kg/hr (09/05/14 1251)

## 2014-09-05 NOTE — Progress Notes (Signed)
Pharmacy: Re- Bivalirudin  Patient's a 64 y.o M with pork and beef allergies on bivalirudin for afib.  APTT now back therapeutic at 61 after rate resumed at 0.22 mg/kg/hr post INR lab draw.  No bleeding documented.  Plan: - continue current rate of 0.22 mg/kg/hr - f/u with AM aPTT and adjust dose if needed  Dorna LeitzAnh Lynne Righi, PharmD, BCPS 09/05/2014 3:12 PM

## 2014-09-05 NOTE — Progress Notes (Signed)
ANTICOAGULATION CONSULT NOTE - Follow Up Consult  Pharmacy Consult for Bivalirudin Indication: atrial fibrillation  Allergies  Allergen Reactions  . Beef-Derived Products Hives  . Pork-Derived Products Hives    Patient Measurements: Height: 5\' 8"  (172.7 cm) Weight: 192 lb 10.9 oz (87.4 kg) IBW/kg (Calculated) : 68.4 Heparin Dosing Weight:   Vital Signs: Temp: 97.9 F (36.6 C) (04/13 2130) Temp Source: Oral (04/13 2130) BP: 133/77 mmHg (04/13 2130) Pulse Rate: 51 (04/13 2130)  Labs:  Recent Labs  09/02/14 0445 09/03/14 0436 09/03/14 1338  09/04/14 0438 09/04/14 1148 09/04/14 1650 09/04/14 2202  HGB  --  14.2  --   --  13.9  --   --   --   HCT  --  40.3  --   --  40.5  --   --   --   PLT  --  235  --   --  241  --   --   --   APTT 55* 59*  --   < > 64*  --  62* 63*  LABPROT 22.6* 25.6* 17.5*  --  26.5* 18.8*  --   --   INR 1.97* 2.31* 1.42  --  2.42* 1.55*  --   --   CREATININE 1.16  --   --   --   --   --   --   --   < > = values in this interval not displayed.  Estimated Creatinine Clearance: 71 mL/min (by C-G formula based on Cr of 1.16).   Medications:  Infusions:  . bivalirudin (ANGIOMAX) infusion 0.5 mg/mL (Non-ACS indications) 0.22 mg/kg/hr (09/04/14 1458)    Assessment: Patient with afib on bivalirudin due to pork allergy, with PTT at goal.  No bivalirudin issues noted.  Goal of Therapy:  aPTT 50-85 seconds Monitor platelets by anticoagulation protocol: Yes   Plan:  Continue with bivalirudin at current rate. Recheck with AM labs  Aleene DavidsonGrimsley Jr, Myles Tavella Crowford 09/05/2014,2:01 AM

## 2014-09-05 NOTE — Progress Notes (Signed)
ANTICOAGULATION CONSULT NOTE - Follow Up Consult  Pharmacy Consult for Bivalirudin, warfarin Indication: Atrial fibrillation  Allergies  Allergen Reactions  . Beef-Derived Products Hives  . Pork-Derived Products Hives    Patient Measurements: Height: 5\' 8"  (172.7 cm) Weight: 192 lb 10.9 oz (87.4 kg) IBW/kg (Calculated) : 68.4  Vital Signs: Temp: 97.4 F (36.3 C) (04/14 0557) Temp Source: Oral (04/14 0557) BP: 130/81 mmHg (04/14 0557) Pulse Rate: 44 (04/14 0557)  Labs:  Recent Labs  09/03/14 0436  09/04/14 0438 09/04/14 1148 09/04/14 1650 09/04/14 2202 09/05/14 0518  HGB 14.2  --  13.9  --   --   --  14.0  HCT 40.3  --  40.5  --   --   --  40.4  PLT 235  --  241  --   --   --  233  APTT 59*  < > 64*  --  62* 63* 68*  LABPROT 25.6*  < > 26.5* 18.8*  --   --  26.6*  INR 2.31*  < > 2.42* 1.55*  --   --  2.43*  < > = values in this interval not displayed.  Estimated Creatinine Clearance: 71 mL/min (by C-G formula based on Cr of 1.16).  Medications:  Infusions:  . bivalirudin (ANGIOMAX) infusion 0.5 mg/mL (Non-ACS indications) 0.22 mg/kg/hr (09/05/14 0401)    Assessment: [age] yoM admitted with alcohol withdrawal seizure found to be in afib with RVR and elevated troponins.  Bivalirudin started 4/4 for r/o ACS since patient has pork allergy and liver dysfunction.  Warfarin initiated 4/6 but anticoagulation was subsequently d/c'd  same day d/t concern of compliance and alcohol abuse. On 4/7 cardiology decided to pursue DCCV so recommend resume Bivalirudin and warfarin. ASA was stopped.  Today, 09/05/2014:   Day 9 warfarin bridged with bivalirudin  INR 2.43, therapeutic   aPTT 68, therapeutic on bivalirudin at 37.6 ml/hr  CBC wnl.  Previous Thrombocytopenia suspected d/t h/o ETOH abuse.  AST/ALT improving, 98/240 (4/10)  No bleeding/complications reported.  Diet: Regular, eating 100% of meals  Goal of Therapy:  INR 2-3 aPTT 50-85 seconds Monitor platelets by  anticoagulation protocol: Yes   Plan:   D/C Bivalirudin  Recheck INR 4 hors after bivalirudin turned off.    If INR < 2, resume bivalirudin at the previous rate of 0.22 mg/kg/hr (37.6 ml/hr).    Warfarin 10mg  PO x1 tonight   Watch INR trend closely d/t liver dysfunction and EtOH abuse.    Daily INR, aPTT, CBC  Patient has received warfarin education   Cardiology to consider change from planned warfarin anticoagulation to DOAC.  Recommend Xarelto 20mg  once daily with supper.   Pharmacy can provide 4 weeks of NO cost either Xarelto or Eliquis to the patient.   Lynann Beaverhristine Syaire Saber PharmD, BCPS Pager [phone] 09/05/2014 6:20 AM

## 2014-09-06 LAB — CBC
HEMATOCRIT: 41.5 % (ref 39.0–52.0)
Hemoglobin: 14.3 g/dL (ref 13.0–17.0)
MCH: 33.5 pg (ref 26.0–34.0)
MCHC: 34.5 g/dL (ref 30.0–36.0)
MCV: 97.2 fL (ref 78.0–100.0)
PLATELETS: 259 10*3/uL (ref 150–400)
RBC: 4.27 MIL/uL (ref 4.22–5.81)
RDW: 13.2 % (ref 11.5–15.5)
WBC: 6.1 10*3/uL (ref 4.0–10.5)

## 2014-09-06 LAB — BASIC METABOLIC PANEL
Anion gap: 7 (ref 5–15)
BUN: 19 mg/dL (ref 6–23)
CO2: 27 mmol/L (ref 19–32)
Calcium: 9 mg/dL (ref 8.4–10.5)
Chloride: 103 mmol/L (ref 96–112)
Creatinine, Ser: 1.02 mg/dL (ref 0.50–1.35)
GFR calc Af Amer: 89 mL/min — ABNORMAL LOW (ref >90)
GFR calc non Af Amer: 77 mL/min — ABNORMAL LOW (ref >90)
GLUCOSE: 128 mg/dL — AB (ref 70–99)
POTASSIUM: 4.3 mmol/L (ref 3.5–5.1)
SODIUM: 137 mmol/L (ref 135–145)

## 2014-09-06 LAB — PROTIME-INR
INR: 3.12 — ABNORMAL HIGH (ref 0.00–1.49)
INR: 1.87 — ABNORMAL HIGH (ref 0.00–1.49)
PROTHROMBIN TIME: 32.4 s — AB (ref 11.6–15.2)
PROTHROMBIN TIME: 21.7 s — AB (ref 11.6–15.2)

## 2014-09-06 LAB — APTT: aPTT: 74 seconds — ABNORMAL HIGH (ref 24–37)

## 2014-09-06 MED ORDER — WARFARIN SODIUM 5 MG PO TABS: 12.5000 mg | ORAL_TABLET | Freq: Once | ORAL | Status: DC

## 2014-09-06 MED ORDER — METOPROLOL TARTRATE 25 MG PO TABS
37.5000 mg | ORAL_TABLET | Freq: Two times a day (BID) | ORAL | Status: DC
Start: 2014-09-06 — End: 2014-09-07
  Administered 2014-09-06 (×2): 37.5 mg via ORAL
  Filled 2014-09-06 (×2): qty 2

## 2014-09-06 MED ORDER — SODIUM CHLORIDE 0.9 % IV SOLN
0.2200 mg/kg/h | INTRAVENOUS | Status: DC
  Administered 2014-09-06 – 2014-09-07 (×2): 0.22 mg/kg/h via INTRAVENOUS
  Filled 2014-09-06 (×3): qty 250

## 2014-09-06 MED ORDER — WARFARIN SODIUM 5 MG PO TABS
15.0000 mg | ORAL_TABLET | Freq: Once | ORAL | Status: AC
  Administered 2014-09-06: 15 mg via ORAL
  Filled 2014-09-06: qty 3

## 2014-09-06 MED ORDER — RAMIPRIL 5 MG PO CAPS
5.0000 mg | ORAL_CAPSULE | Freq: Every day | ORAL | Status: DC
  Administered 2014-09-06 – 2014-09-08 (×3): 5 mg via ORAL
  Filled 2014-09-06 (×3): qty 1

## 2014-09-06 NOTE — Progress Notes (Signed)
ANTICOAGULATION CONSULT NOTE - Follow Up Consult  Pharmacy Consult for Bivalirudin, warfarin Indication: Atrial fibrillation  Allergies  Allergen Reactions  . Beef-Derived Products Hives  . Pork-Derived Products Hives    Patient Measurements: Height: 5\' 8"  (172.7 cm) Weight: 192 lb 10.9 oz (87.4 kg) IBW/kg (Calculated) : 68.4  Vital Signs: Temp: 97.5 F (36.4 C) (04/15 0640) Temp Source: Oral (04/15 0640) BP: 149/89 mmHg (04/15 0922) Pulse Rate: 67 (04/15 0922)  Labs:  Recent Labs  09/04/14 0438  09/05/14 0518 09/05/14 1050 09/05/14 1357 09/06/14 0522 09/06/14 1248  HGB 13.9  --  14.0  --   --  14.3  --   HCT 40.5  --  40.4  --   --  41.5  --   PLT 241  --  233  --   --  259  --   APTT 64*  < > 68*  --  61* 74*  --   LABPROT 26.5*  < > 26.6* 19.3*  --  32.4* 21.7*  INR 2.42*  < > 2.43* 1.61*  --  3.12* 1.87*  CREATININE  --   --   --   --   --  1.02  --   < > = values in this interval not displayed.  Estimated Creatinine Clearance: 80.7 mL/min (by C-G formula based on Cr of 1.02).  Medications:  Infusions:  . bivalirudin (ANGIOMAX) infusion 0.5 mg/mL (Non-ACS indications) 0.22 mg/kg/hr (09/06/14 1333)    Assessment: 63 yoM found to be in afib with RVR. 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 S/p DCCV 4/12 on Bivalirudin bridge to warfarin.   INR 3.12, supra-therapeutic on bivalirudin this morning, but decreased to subtherapeutic INR 1.87 off bivalirudin this afternoon.  INR continues to increase daily.  INR trend off bivalirudin: 4/12: 1.42 4/13: 1.55 4/14: 1.61 4/15: 1.87   Goal of Therapy:  INR 2-3 aPTT 50-85 seconds Monitor platelets by anticoagulation protocol: Yes   Plan:   Resume bivalirudin at the previous rate of 0.22 mg/kg/hr (37.6 ml/hr).  Daily APTT and INR  Warfarin 15 mg PO x1 tonight  Aggressive warfarin dose tonight with goal INR > 3 while on bivalirudin  Would NOT discharge on daily dose of 15mg  as  this may lead to supra-therapeutic INR   Lynann Beaverhristine Patsey Pitstick PharmD, BCPS Pager [phone] 09/06/2014 1:57 PM

## 2014-09-06 NOTE — Progress Notes (Signed)
CARE MANAGEMENT NOTE 09/06/2014  Patient:  Benjamin Blackwell,Benjamin Blackwell   Account Number:  [id number]  Date Initiated:  08/26/2014  Documentation initiated by:  DAVIS,RHONDA  Subjective/Objective Assessment:   etoh abuse with seizures/a,fib with rvr     Action/Plan:   home when stable   Anticipated DC Date:  08/29/2014   Anticipated DC Plan:  HOME/SELF CARE  In-house referral  Clinical Social Worker  Artistinancial Counselor      DC Planning Services  CM consult  [facility]      Choice offered to / List presented to:  C-1 Patient           Status of service:  In process, will continue to follow Medicare Important Message given?   (If response is "NO", the following Medicare IM given date fields will be blank) Date Medicare IM given:   Medicare IM given by:   Date Additional Medicare IM given:   Additional Medicare IM given by:    Discharge Disposition:    Per UR Regulation:  Reviewed for med. necessity/level of care/duration of stay  If discussed at Long Length of Stay Meetings, dates discussed:    Comments:  09/06/14 MMcGibboney, RN, BSN Pt has appointment for Monday 18th at [facility] at 3:30 PM.  Also for 5/24 at 2:30 PM at [facility] [place] [phone]. Pt is aware and understands. Pt states that he should be able to purchase his medications.   09/04/14 MMcGibboney, RN, BSN Spoke with pt concerning PCP follow up. Pt states that he will find a PCP when he gets back home. I have made several calls and will continue in am to find a Clinic or PCP taking new pt[place] in [place].   09/02/14 MMcgibboney, RN, BSN Pt from [place] and will follow up with MD there.   [id number]/Rhonda Earlene Plateravis, RN, BSN, CCN: [id number]/[phone] Case management. Chart reviewed for discharge planning and present needs. Discharge needs: none present at time of review.

## 2014-09-06 NOTE — Progress Notes (Signed)
Patient ID: Benjamin Blackwell, male   DOB: 1951-03-18, 64 y.o.   MRN: [id number] [facility] PROGRESS NOTE  Olney Monier ONG:[id number] DOB: 07-13-1950 DOA: 08/25/2014 PCP: No primary care provider on file.  Brief narrative:    64 y.o. male with past medical history of alcohol abuse and alcohol dependence who presented to [facility] with alcohol related withdrawal seizures. Of note, he has completed detox therapy for alcohol dependency.  Hospital course was complicated with finding of atrial fibrillation with RVR. Cardiology has seen the patient in consultation and pt underwent TEE cardioversion 09/02/2014 and requirement is to continue 6 weeks of Coumadin therapy on discharge.   PT underwent stress test for risk stratification. He is on angiomax drip and coumadin. Goal is to have 2 INR in 2-3 range.     Assessment/Plan:    Principal Problem: Alcohol related withdrawal seizures / history of alcohol abuse and alcohol dependence - Patient completed detox on CIWA - No withdrawals. - Continue folic acid, multivitamin and thiamine.  Active Problems: Hypokalemia - Secondary to alcohol abuse, Hctz. - Supplemented   Metabolic acidosis - Secondary to alcohol abuse. Resolved.   Atrial fibrillation/flutter with RVR /  - CHADS vasc score 1. 2-D echo on this admission with normal ejection fraction. - S/P TEE cardioversion 09/02/2014. Coumadin for 6 weeks thereafter - Rate control with metoprol  ST elevations in inferior leads / Troponin elevation - Secondary to demand ischemia from A fib. - AC with coumadin for 6 weeks after TEE 09/02/2014. - Stress test 09/03/2014 showed fixed severe basal to mid inferior and inferolateral defect suggests prior MI, no evidence for significant ischemia.    Essential hypertension - Continue clonidine 0.2 mg twice daily, HCTZ 25 mg daily, metoprolol 50 mg twice daily. 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 - LFTs improved with steroids. Steroids stopped 09/04/14.    Dyslipidemia - Continue Lipitor    Hyperglycemia/prediabetes - Hemoglobin A1c 5.8%. Counseled.    Depression - Continue Prozac. Stable.   Thrombocytopenia - Due to bone marrow suppression from alcohol abuse.  - Platelets WNL.   DVT Prophylaxis  - On full dose anticoagulation with Coumadin.  - Continue angiomax drip to bridge with coumadin, needs 2 consecutive INR 2-3 range    Code Status: Full.  Family Communication:  No family at the bedside.  Disposition Plan: need 2 consecutive INR to be 2-3; last 2 INR 3.12, 1.87  IV access:  Peripheral IV  Procedures and diagnostic studies:    Ct Head Wo Contrast 08/25/2014: 1. 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 No significant mass effect to suggest acute or subacute process. 2. Cerebral atrophy and small vessel ischemic change.   Dg Chest Port 1 View 08/25/2014: Moderate cardiomegaly with central vascular congestion but no focal acute finding.   2-D echo 08/27/14  Stress test 09/03/2014   Medical Consultants:  Cardiology  Other Consultants:  None   IAnti-Infectives:   None    [name], MD  [facility] Pager [phone]  If 7PM-7AM, please contact night-coverage [email or web address] Password [facility] 09/06/2014, 5:24 PM   LOS: 12 days    Time spent in minutes: 15 minutes   HPI/Subjective: No acute overnight events. Patient reports no bleeding.   Objective: Filed Vitals:   09/05/14 2247 09/06/14 0640 09/06/14 0922 09/06/14 1457  BP: 132/73 141/81 149/89 124/75  Pulse: 52 51 67 51  Temp: 97.6 F (36.4 C)  97.5 F (36.4 C)  98.4 F (36.9 C)  TempSrc: Oral Oral  Oral  Resp: 18 18  20   Height:      Weight:      SpO2: 99% 100%  97%    Intake/Output Summary (Last 24 hours) at 09/06/14 1724 Last data filed at 09/06/14 1457  Gross per 24 hour  Intake    990 ml  Output    2581 ml  Net  -1591 ml    Exam:   General:  Pt is not in distress  Cardiovascular: S1, S2 (+), rate and rhythm controlled  Respiratory: bilateral air entry, no wheezing   Abdomen: non tender, non distended   Extremities: no leg swelling, no cyanosis   Neuro: Non focal   Data Reviewed: Basic Metabolic Panel:  Recent Labs Lab 09/01/14 0455 09/02/14 0445 09/06/14 0522  NA 139 141 137  K 3.4* 3.8 4.3  CL 103 106 103  CO2 26 26 27   GLUCOSE 141* 144* 128*  BUN 21 21 19   CREATININE 1.06 1.16 1.02  CALCIUM 9.3 9.3 9.0   Liver Function Tests:  Recent Labs Lab 09/01/14 0455  AST 98*  ALT 240*  ALKPHOS 59  BILITOT 0.7  PROT 7.7  ALBUMIN 3.3*   No results for input(s): LIPASE, AMYLASE in the last 168 hours. No results for input(s): AMMONIA in the last 168 hours. CBC:  Recent Labs Lab 09/01/14 0455 09/03/14 0436 09/04/14 0438 09/05/14 0518 09/06/14 0522  WBC 6.7 5.9 9.3 6.7 6.1  HGB 15.5 14.2 13.9 14.0 14.3  HCT 44.0 40.3 40.5 40.4 41.5  MCV 95.7 97.3 96.9 95.7 97.2  PLT 211 235 241 233 259   Cardiac Enzymes: No results for input(s): CKTOTAL, CKMB, CKMBINDEX, TROPONINI in the last 168 hours. BNP: Invalid input(s): POCBNP CBG: No results for input(s): GLUCAP in the last 168 hours.  Recent Results (from the past 240 hour(s))  MRSA PCR Screening     Status: None   Collection Time: 08/25/14  6:40 PM  Result Value Ref Range Status   MRSA by PCR NEGATIVE NEGATIVE Final     Scheduled Meds: . atorvastatin  10 mg Oral q1800  . cloNIDine  0.2 mg Oral BID  . FLUoxetine  20 mg Oral Daily  . folic acid  1 mg Oral Daily  . hydrochlorothiazide  25 mg Oral Daily  . metoprolol tartrate  50 mg Oral BID  . mirtazapine  7.5 mg Oral QHS  . multivitamin with minerals  1 tablet Oral Daily  . pantoprazole  40 mg Oral Q0600  . potassium chloride  20 mEq Oral BID  . prednisoLONE  30 mg Oral Daily  . thiamine  100 mg Oral Daily   Or  . thiamine  100 mg Intravenous  Daily  . warfarin  6 mg Oral ONCE-1800   Continuous Infusions: . bivalirudin (ANGIOMAX) infusion 0.5 mg/mL (Non-ACS indications) 0.22 mg/kg/hr (09/06/14 1333)

## 2014-09-06 NOTE — Progress Notes (Addendum)
SUBJECTIVE:  No complaints  OBJECTIVE:   Vitals:   Filed Vitals:   09/05/14 1238 09/05/14 1409 09/05/14 2247 09/06/14 0640  BP: 135/81 127/66 132/73 141/81  Pulse: 49 58 52 51  Temp:  98.6 F (37 C) 97.6 F (36.4 C) 97.5 F (36.4 C)  TempSrc:  Oral Oral Oral  Resp:  Height:      Weight:      SpO2:  100% 99% 100%   I&O's:   Intake/Output Summary (Last 24 hours) at 09/06/14 0825 Last data filed at 09/06/14 0641  Gross per 24 hour  Intake   1100 ml  Output   2100 ml  Net  -1000 ml   TELEMETRY: Reviewed telemetry pt in sinus bradycardia:     PHYSICAL EXAM General: Well developed, well nourished, in no acute distress Head: Eyes PERRLA, No xanthomas.   Normal cephalic and atramatic  Lungs:   Clear bilaterally to auscultation and percussion. Heart:   HRRR S1 S2 Pulses are 2+ & equal. Abdomen: Bowel sounds are positive, abdomen soft and non-tender without masses  Extremities:   No clubbing, cyanosis or edema.  DP +1 Neuro: Alert and oriented X 3. Psych:  Good affect, responds appropriately   LABS: Basic Metabolic Panel: No results for input(s): NA, K, CL, CO2, GLUCOSE, BUN, CREATININE, CALCIUM, MG, PHOS in the last 72 hours. Liver Function Tests: No results for input(s): AST, ALT, ALKPHOS, BILITOT, PROT, ALBUMIN in the last 72 hours. No results for input(s): LIPASE, AMYLASE in the last 72 hours. CBC:  Recent Labs  09/05/14 0518 09/06/14 0522  WBC 6.7 6.1  HGB 14.0 14.3  HCT 40.4 41.5  MCV 95.7 97.2  PLT 233 259   Cardiac Enzymes: No results for input(s): CKTOTAL, CKMB, CKMBINDEX, TROPONINI in the last 72 hours. BNP: Invalid input(s): POCBNP D-Dimer: No results for input(s): DDIMER in the last 72 hours. Hemoglobin A1C: No results for input(s): HGBA1C in the last 72 hours. Fasting Lipid Panel: No results for input(s): CHOL, HDL, LDLCALC, TRIG, CHOLHDL, LDLDIRECT in the last 72 hours. Thyroid Function Tests: No results for input(s): TSH,  T4TOTAL, T3FREE, THYROIDAB in the last 72 hours.  Invalid input(s): FREET3 Anemia Panel: No results for input(s): VITAMINB12, FOLATE, FERRITIN, TIBC, IRON, RETICCTPCT in the last 72 hours. Coag Panel:   Lab Results  Component Value Date   INR 3.12* 09/06/2014   INR 1.61* 09/05/2014   INR 2.43* 09/05/2014    RADIOLOGY: Ct Head Wo Contrast  08/25/2014   CLINICAL DATA:  Ethanol abuse and seizure.  EXAM: CT HEAD WITHOUT CONTRAST  TECHNIQUE: Contiguous axial images were obtained from the base of the skull through the vertex without intravenous contrast.  COMPARISON:  None.  FINDINGS: Sinuses/Soft tissues: Clear paranasal sinuses and mastoid air cells.  Intracranial: Age advanced cerebral atrophy. Left occipital lobe hypoattenuation is most consistent with remote infarct. Mild low density in the periventricular white matter likely related to small vessel disease. No mass lesion, hemorrhage, hydrocephalus, acute infarct, intra-axial, or extra-axial fluid collection.  IMPRESSION: 1. 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 No significant mass effect to suggest acute or subacute process. 2.  Cerebral atrophy and small vessel ischemic change.   Electronically Signed   By: Jeronimo Greaves M.D.   On: 08/25/2014 17:07   Nm Myocar Multi W/spect W/wall Motion / Ef  09/04/2014   CLINICAL DATA:  Chest pain  EXAM: Lexiscan Myovue  TECHNIQUE: The patient received IV  Lexiscan .4mg  over 15 seconds. 33.0 mCi of Technetium 10842m Sestamibi injected at 30 seconds. Quantitative SPECT images were obtained in the vertical, horizontal and short axis planes after a 45 minute delay. Rest images were obtained with similar planes and delay using 10.2 mCi of Technetium 4342m Sestamibi.  FINDINGS: ECG:  No ECG changes with Lexiscan infusion.  Quantitiative Gated SPECT EF: 10% with diffuse hypokinesis.  Perfusion Images: There was a medium-sized, severe basal to mid inferior and inferolateral perfusion  defect at both rest and with stress.  IMPRESSION: High risk study due to low EF. Fixed severe basal to mid inferior and inferolateral defect suggests prior MI, no evidence for significant ischemia. The degree of depression of ejection fraction is not totally explained by the defect.  Dalton Mclean   Electronically Signed   By: Marca Anconaalton  Mclean M.D.   On: 09/04/2014 17:19   Dg Chest Port 1 View  08/25/2014   CLINICAL DATA:  Shortness of breath  EXAM: PORTABLE CHEST - 1 VIEW  COMPARISON:  None.  FINDINGS: The heart size is moderately enlarged without evidence for edema. Cardiac leads obscure detail. Both lungs are clear. The visualized skeletal structures are unremarkable.  IMPRESSION: Moderate cardiomegaly with central vascular congestion but no focal acute finding.   Electronically Signed   By: Christiana PellantGretchen  Green M.D.   On: 08/25/2014 15:48   ASSESSMENT AND PLAN  1. Paroxysmal atrial flutter with controlled variable ventricular response. Now s/p TEE/DCCV to NSR. Maintaining sinus bradycardia on metoprolol. Will continue to load Warfarin. Would prefer to continue on this route instead of changing to NOAC given he was just cardioverted and there are no guidelines established yet for safety of changing to NOAC right after DCCV in regards to risk of stroke. 2. Alcohol abuse 3. Pork allergy requiring bivalirudin as anticoagulant. Patient states anaphylaxis with pork. Will need chronic anticoagulation for 6 weeks. INR therapeutic. Will stop Bivalirudin and recheck INR 4 hours later. 4. Abnormal LFTs improving off alcohol 5. Mild hypokalemia.Repleted 6. HTN - Controlled on clonidine/HCTZ and BB 7. Elevated Troponin - mildly elevated with flat trend and normal LVF on echo. Most likely demand ischemia in setting of rapid afib/flutter. Nuclear stress test high risk only due to reduced LVF which is most likely secondary to tachycrdia induced DCM. Would recheck 2D echo in 2 months and if EF has not  improved then consider cath.  8. 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 Continue BB but decrease to 37.5mg   BID due to bradycardia.  Continue ACE I and increase to 5mg  daily since we are cutting back BB dose.  Check BMET today.    If he goes home today he will need an INR check in our office on Monday and followup with Dr. Clifton JamesMcAlhany as an outpt.      Quintella ReichertURNER,Loghan Kurtzman R, MD  09/06/2014  8:25 AM

## 2014-09-06 NOTE — Progress Notes (Signed)
BRIEF NUTRITION NOTE  Pt seen for DM education 4/6. LOS now day 12. Pt has been eating 100% of nearly all meals on Low sodium, Heart Healthy diet.  Per rounds this AM, pt is a possible d/c this afternoon. Pt with hx of alcohol abuse who underwent detox following admission.  No further needs at this time. Will continue to monitor PRN for needs.    Trenton GammonJessica Handsome Anglin, RD, LDN Inpatient Clinical Dietitian Pager # [phone] After hours/weekend pager # [phone]

## 2014-09-06 NOTE — Progress Notes (Signed)
ANTICOAGULATION CONSULT NOTE - Follow Up Consult  Pharmacy Consult for Bivalirudin, warfarin Indication: Atrial fibrillation  Allergies  Allergen Reactions  . Beef-Derived Products Hives  . Pork-Derived Products Hives    Patient Measurements: Height: 5\' 8"  (172.7 cm) Weight: 192 lb 10.9 oz (87.4 kg) IBW/kg (Calculated) : 68.4  Vital Signs: Temp: 97.5 F (36.4 C) (04/15 0640) Temp Source: Oral (04/15 0640) BP: 141/81 mmHg (04/15 0640) Pulse Rate: 51 (04/15 0640)  Labs:  Recent Labs  09/04/14 0438  09/05/14 0518 09/05/14 1050 09/05/14 1357 09/06/14 0522  HGB 13.9  --  14.0  --   --  14.3  HCT 40.5  --  40.4  --   --  41.5  PLT 241  --  233  --   --  259  APTT 64*  < > 68*  --  61* 74*  LABPROT 26.5*  < > 26.6* 19.3*  --  32.4*  INR 2.42*  < > 2.43* 1.61*  --  3.12*  < > = values in this interval not displayed.  Estimated Creatinine Clearance: 71 mL/min (by C-G formula based on Cr of 1.16).  Warfarin doses: 4/6 - 4/10: 5mg  4/11: 6mg  4/12: 7.5 mg 4/13: 10 mg 4/14: 12.5mg   Assessment: [age] yoM admitted with alcohol withdrawal seizure found to be in afib with RVR and elevated troponins.  Bivalirudin started 4/4 for r/o ACS since patient has pork allergy and liver dysfunction.  Warfarin initiated 4/6 but anticoagulation was subsequently d/c'd  same day d/t concern of compliance and alcohol abuse. On 4/7 cardiology decided to pursue DCCV so recommend resume Bivalirudin and warfarin. ASA was stopped.  Today, 09/06/2014:   INR 3.12, supra-therapeutic while on bivalirudin  aPTT 74, therapeutic on bivalirudin at 37.6 ml/hr  CBC wnl.  Previous Thrombocytopenia suspected d/t h/o ETOH abuse.  AST/ALT improving, 98/240 (4/10)  No bleeding/complications reported.  Diet: Regular, eating 100% of meals  Goal of Therapy:  INR 2-3 aPTT 50-85 seconds Monitor platelets by anticoagulation protocol: Yes   Plan:   D/C Bivalirudin  Recheck INR 4 hors after bivalirudin is  turned off.    If INR < 2, resume bivalirudin at the previous rate of 0.22 mg/kg/hr (37.6 ml/hr).     Daily INR, aPTT, CBC  Patient has received warfarin education  Warfarin to be dosed on next INR once bivalirudin is off.  Lynann Beaverhristine Brendolyn Stockley PharmD, BCPS Pager [phone] 09/06/2014 7:10 AM

## 2014-09-07 DIAGNOSIS — F1029 Alcohol dependence with unspecified alcohol-induced disorder: Secondary | ICD-10-CM

## 2014-09-07 DIAGNOSIS — I429 Cardiomyopathy, unspecified: Secondary | ICD-10-CM

## 2014-09-07 LAB — CBC
HCT: 40.4 % (ref 39.0–52.0)
Hemoglobin: 14.3 g/dL (ref 13.0–17.0)
MCH: 34 pg (ref 26.0–34.0)
MCHC: 35.4 g/dL (ref 30.0–36.0)
MCV: 96 fL (ref 78.0–100.0)
Platelets: 225 10*3/uL (ref 150–400)
RBC: 4.21 MIL/uL — ABNORMAL LOW (ref 4.22–5.81)
RDW: 13 % (ref 11.5–15.5)
WBC: 5.6 10*3/uL (ref 4.0–10.5)

## 2014-09-07 LAB — BASIC METABOLIC PANEL
ANION GAP: 5 (ref 5–15)
BUN: 18 mg/dL (ref 6–23)
CHLORIDE: 108 mmol/L (ref 96–112)
CO2: 27 mmol/L (ref 19–32)
Calcium: 9 mg/dL (ref 8.4–10.5)
Creatinine, Ser: 1.04 mg/dL (ref 0.50–1.35)
GFR, EST AFRICAN AMERICAN: 87 mL/min — AB (ref >90)
GFR, EST NON AFRICAN AMERICAN: 75 mL/min — AB (ref >90)
Glucose, Bld: 121 mg/dL — ABNORMAL HIGH (ref 70–99)
Potassium: 4.2 mmol/L (ref 3.5–5.1)
SODIUM: 140 mmol/L (ref 135–145)

## 2014-09-07 LAB — PROTIME-INR
INR: 2.41 — ABNORMAL HIGH (ref 0.00–1.49)
Prothrombin Time: 26.5 seconds — ABNORMAL HIGH (ref 11.6–15.2)

## 2014-09-07 LAB — APTT
aPTT: 85 seconds — ABNORMAL HIGH (ref 24–37)
aPTT: 85 seconds — ABNORMAL HIGH (ref 24–37)

## 2014-09-07 MED ORDER — WARFARIN SODIUM 6 MG PO TABS
6.0000 mg | ORAL_TABLET | Freq: Once | ORAL | Status: AC
  Administered 2014-09-07: 6 mg via ORAL
  Filled 2014-09-07: qty 1

## 2014-09-07 MED ORDER — METOPROLOL SUCCINATE ER 50 MG PO TB24
75.0000 mg | ORAL_TABLET | Freq: Every day | ORAL | Status: DC
  Administered 2014-09-07 – 2014-09-08 (×2): 75 mg via ORAL
  Filled 2014-09-07 (×4): qty 1

## 2014-09-07 NOTE — Progress Notes (Addendum)
Pt seen and examined at bedside No changes in management since past 24 hours.  We are monitoring INR, need 2 therapeutic levels prior to discharge.  No overnight events.  Manson Passeylma Clarke Peretz [facility] [phone].

## 2014-09-07 NOTE — Progress Notes (Signed)
Consulting cardiologist: Dr. Verne Carrowhristopher McAlhany  Seen for followup: Paroxysmal atrial flutter, cardiomyopathy  Subjective:    Sitting up in chair, no chest pain or palpitations.  Objective:   Temp:  [97.7 F (36.5 C)-98.4 F (36.9 C)] 98.1 F (36.7 C) (04/16 0557) Pulse Rate:  [51-67] 52 (04/16 0557) Resp:  [20] 20 (04/16 0557) BP: (115-149)/(75-89) 147/85 mmHg (04/16 0557) SpO2:  [97 %-100 %] 98 % (04/16 0557) Last BM Date: 09/06/14  Filed Weights   08/25/14 1851 08/27/14 0400 08/28/14 0400  Weight: 188 lb 7.9 oz (85.5 kg) 195 lb 12.3 oz (88.8 kg) 192 lb 10.9 oz (87.4 kg)    Intake/Output Summary (Last 24 hours) at 09/07/14 0913 Last data filed at 09/07/14 0253  Gross per 24 hour  Intake    240 ml  Output   1981 ml  Net  -1741 ml    Telemetry: Normal sinus rhythm.  Exam:  General: Appears comfortable.  Lungs: Clear, nonlabored.  Cardiac: Indistinct PMI, RRR without gallop.  Extremities: No pitting edema.  Lab Results:  Basic Metabolic Panel:  Recent Labs Lab 09/02/14 0445 09/06/14 0522 09/07/14 0502  NA 141 137 140  K 3.8 4.3 4.2  CL 106 103 108  CO2 26 27 27   GLUCOSE 144* 128* 121*  BUN 21 19 18   CREATININE 1.16 1.02 1.04  CALCIUM 9.3 9.0 9.0    Liver Function Tests:  Recent Labs Lab 09/01/14 0455  AST 98*  ALT 240*  ALKPHOS 59  BILITOT 0.7  PROT 7.7  ALBUMIN 3.3*    CBC:  Recent Labs Lab 09/05/14 0518 09/06/14 0522 09/07/14 0502  WBC 6.7 6.1 5.6  HGB 14.0 14.3 14.3  HCT 40.4 41.5 40.4  MCV 95.7 97.2 96.0  PLT 233 259 225    Coagulation:  Recent Labs Lab 09/06/14 0522 09/06/14 1248 09/07/14 0502  INR 3.12* 1.87* 2.41*    TEE 09/02/2014: Study Conclusions  - Left ventricle: Systolic function was moderately to severely reduced. The estimated ejection fraction was in the range of 30% to 35%. Diffuse hypokinesis. - Aortic valve: There was trivial regurgitation. - Mitral valve: No evidence of  vegetation. There was mild regurgitation. - Left atrium: The atrium was moderately dilated. No evidence of thrombus in the atrial cavity or appendage. - Atrial septum: No defect or patent foramen ovale was identified. - Tricuspid valve: No evidence of vegetation. - Pulmonic valve: No evidence of vegetation.  Impressions:  - Limited study due to poor patient tolerance; terminated early; moderate to severe global reduction in LV function; moderate LAE; no LAA thrombus, mild MR, trace TR.   Medications:   Scheduled Medications: . atorvastatin  10 mg Oral q1800  . cloNIDine  0.2 mg Oral BID  . FLUoxetine  20 mg Oral Daily  . folic acid  1 mg Oral Daily  . hydrochlorothiazide  25 mg Oral Daily  . metoprolol tartrate  37.5 mg Oral BID  . mirtazapine  7.5 mg Oral QHS  . multivitamin with minerals  1 tablet Oral Daily  . pantoprazole  40 mg Oral Q0600  . potassium chloride  20 mEq Oral BID  . ramipril  5 mg Oral Daily  . thiamine  100 mg Oral Daily  . Warfarin - Pharmacist Dosing Inpatient   Does not apply q1800     Infusions: . bivalirudin (ANGIOMAX) infusion 0.5 mg/mL (Non-ACS indications) 0.22 mg/kg/hr (09/07/14 0548)     PRN Medications:  acetaminophen **OR** acetaminophen, alum & mag hydroxide-simeth,  feeding supplement (ENSURE ENLIVE), ondansetron **OR** ondansetron (ZOFRAN) IV   Assessment:   1. Paroxysmal atrial flutter status post recent TEE cardioversion, maintaining sinus rhythm. Patient is on bivalirudin as bridge on Coumadin, followed by pharmacy. Aiming for true therapeutic range for 48 hours. Based on note review it looks like INR this morning was therapeutic off bivalirudin.  2. Anaphylactic pork allergy, on bivalirudin for bridging on Coumadin.  3. History of alcohol abuse.  4. Demand ischemia, peak troponin I only 0.12, relatively flat pattern.  5. 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 No active  ischemia by Myoview with fixed severe basal to mid inferior and inferolateral defect suggesting prior infarct. 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  6. Essential hypertension.  Plan/Discussion:    INR in therapeutic range this morning for the first time off bivalirudin. Medications reviewed, will try and simplify regimen, change from Lopressor to Toprol-XL, otherwise continue Altace and HCTZ. Nearing discharge. He will need to have follow-up early next week in the office for an INR check and then to see Dr. Clifton James or associate within 7-10 days.   Jonelle Sidle, M.D., F.A.C.C.

## 2014-09-07 NOTE — Progress Notes (Addendum)
ANTICOAGULATION CONSULT NOTE - Follow Up Consult  Pharmacy Consult for Bivalirudin, warfarin Indication: Atrial fibrillation  Allergies  Allergen Reactions  . Beef-Derived Products Hives  . Pork-Derived Products Hives    Patient Measurements: Height: 5\' 8"  (172.7 cm) Weight: 192 lb 10.9 oz (87.4 kg) IBW/kg (Calculated) : 68.4  Vital Signs: Temp: 98.1 F (36.7 C) (04/16 0557) Temp Source: Oral (04/16 0557) BP: 147/85 mmHg (04/16 0557) Pulse Rate: 52 (04/16 0557)  Labs:  Recent Labs  09/05/14 0518  09/05/14 1357 09/06/14 0522 09/06/14 1248 09/07/14 0502 09/07/14 0853  HGB 14.0  --   --  14.3  --  14.3  --   HCT 40.4  --   --  41.5  --  40.4  --   PLT 233  --   --  259  --  225  --   APTT 68*  --  61* 74*  --   --  85*  LABPROT 26.6*  < >  --  32.4* 21.7* 26.5*  --   INR 2.43*  < >  --  3.12* 1.87* 2.41*  --   CREATININE  --   --   --  1.02  --  1.04  --   < > = values in this interval not displayed.  Estimated Creatinine Clearance: 79.2 mL/min (by C-G formula based on Cr of 1.04).  Medications:  Infusions:  . bivalirudin (ANGIOMAX) infusion 0.5 mg/mL (Non-ACS indications) 0.22 mg/kg/hr (09/07/14 0548)    Assessment: 63 yoM found to be in afib with RVR. 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 S/p DCCV 4/12 on Bivalirudin bridge to warfarin.  Warfarin planned for 6 weeks following conversion.  - INR therapeutic this morning with bivalirudin infusion held for INR check.  Would like 2 consecutive daily INR checks within goal range.  CBC WNL and stable.  No bleeding/complications reported.  - aPTT therapeutic but has been trending up and now at the top of the therapeutic range.  INR trend off bivalirudin: 4/12: 1.42 4/13: 1.55 4/14: 1.61 4/15: 1.87 4/16: 2.40  --> INR therapeutic; however, increased significantly overnight in response to higher warfarin doses.  Will provide a lower dose today and try to avoid supratherapeutic  INR.  Goal of Therapy:  INR 2-3 aPTT 50-85 seconds Monitor platelets by anticoagulation protocol: Yes   Plan:   Warfarin 6 mg PO once tonight  Continue bivalirudin at 0.22 mg/kg/hr (37.6 ml/hr).  Will repeat aPTT in 4 hours to make sure levels are not continuing to advance past therapeutic range.  Plan for this evening is to STOP bivalirudin infusion at 0100 4/17.  Then check INR at 0500 4/17.   Clance BollAmanda Lurlean Kernen, PharmD, BCPS Pager: [phone] 09/07/2014 10:08 AM   Addendum: 09/07/2014 1:51 PM Repeat aPTT stable at 85 seconds Plan: Will continue the bivalirudin infusion at the current rate of 0.22mg /kg/hr.  Continue with plan above for this evening.  Repeat aPTT if needed in AM.    Clance BollAmanda Becka Lagasse, PharmD 09/07/2014

## 2014-09-07 NOTE — Progress Notes (Signed)
ANTICOAGULATION CONSULT NOTE - Follow Up Consult  Pharmacy Consult for warfarin, bivalirudin Indication: atrial fibrillation  Allergies  Allergen Reactions  . Beef-Derived Products Hives  . Pork-Derived Products Hives    Patient Measurements: Height: 5\' 8"  (172.7 cm) Weight: 192 lb 10.9 oz (87.4 kg) IBW/kg (Calculated) : 68.4 Heparin Dosing Weight:   Vital Signs: Temp: 98.1 F (36.7 C) (04/16 0557) Temp Source: Oral (04/16 0557) BP: 147/85 mmHg (04/16 0557) Pulse Rate: 52 (04/16 0557)  Labs:  Recent Labs  09/05/14 0518  09/05/14 1357 09/06/14 0522 09/06/14 1248 09/07/14 0502  HGB 14.0  --   --  14.3  --  14.3  HCT 40.4  --   --  41.5  --  40.4  PLT 233  --   --  259  --  225  APTT 68*  --  61* 74*  --   --   LABPROT 26.6*  < >  --  32.4* 21.7* 26.5*  INR 2.43*  < >  --  3.12* 1.87* 2.41*  CREATININE  --   --   --  1.02  --  1.04  < > = values in this interval not displayed.  Estimated Creatinine Clearance: 79.2 mL/min (by C-G formula based on Cr of 1.04).   Medications:  Scheduled:  . atorvastatin  10 mg Oral q1800  . cloNIDine  0.2 mg Oral BID  . FLUoxetine  20 mg Oral Daily  . folic acid  1 mg Oral Daily  . hydrochlorothiazide  25 mg Oral Daily  . metoprolol tartrate  37.5 mg Oral BID  . mirtazapine  7.5 mg Oral QHS  . multivitamin with minerals  1 tablet Oral Daily  . pantoprazole  40 mg Oral Q0600  . potassium chloride  20 mEq Oral BID  . ramipril  5 mg Oral Daily  . thiamine  100 mg Oral Daily  . Warfarin - Pharmacist Dosing Inpatient   Does not apply q1800   Infusions:  . bivalirudin (ANGIOMAX) infusion 0.5 mg/mL (Non-ACS indications) 0.22 mg/kg/hr (09/07/14 0548)    Assessment: Patient with INR at goal.  Bivalirudin held prior to 4/16 AM INR, consider INR true, without drug/lab interference.  Bivalirudin restarted before INR return.  Goal of Therapy:  INR 2-3 aPTT 50-85 seconds Monitor platelets by anticoagulation protocol: Yes   Plan:   Continue bivalirudin at prior rate, recheck PTT at 0900 Follow up with plan for anticoag with INR at goal.  Darlina GuysGrimsley Jr, Jacquenette ShoneJulian Crowford 09/07/2014,6:01 AM

## 2014-09-08 LAB — CBC
HEMATOCRIT: 39.4 % (ref 39.0–52.0)
Hemoglobin: 13.7 g/dL (ref 13.0–17.0)
MCH: 33.5 pg (ref 26.0–34.0)
MCHC: 34.8 g/dL (ref 30.0–36.0)
MCV: 96.3 fL (ref 78.0–100.0)
Platelets: 234 10*3/uL (ref 150–400)
RBC: 4.09 MIL/uL — ABNORMAL LOW (ref 4.22–5.81)
RDW: 12.9 % (ref 11.5–15.5)
WBC: 5 10*3/uL (ref 4.0–10.5)

## 2014-09-08 LAB — BASIC METABOLIC PANEL
Anion gap: 6 (ref 5–15)
BUN: 17 mg/dL (ref 6–23)
CO2: 29 mmol/L (ref 19–32)
CREATININE: 1.15 mg/dL (ref 0.50–1.35)
Calcium: 9.2 mg/dL (ref 8.4–10.5)
Chloride: 104 mmol/L (ref 96–112)
GFR calc Af Amer: 77 mL/min — ABNORMAL LOW (ref >90)
GFR calc non Af Amer: 66 mL/min — ABNORMAL LOW (ref >90)
Glucose, Bld: 148 mg/dL — ABNORMAL HIGH (ref 70–99)
Potassium: 4.1 mmol/L (ref 3.5–5.1)
Sodium: 139 mmol/L (ref 135–145)

## 2014-09-08 LAB — PROTIME-INR
INR: 3.02 — AB (ref 0.00–1.49)
Prothrombin Time: 31.5 seconds — ABNORMAL HIGH (ref 11.6–15.2)

## 2014-09-08 MED ORDER — METOPROLOL SUCCINATE ER 25 MG PO TB24: 75.0000 mg | ORAL_TABLET | Freq: Every day | ORAL | Status: DC

## 2014-09-08 MED ORDER — RAMIPRIL 5 MG PO CAPS: 5.0000 mg | ORAL_CAPSULE | Freq: Every day | ORAL | Status: DC

## 2014-09-08 MED ORDER — WARFARIN SODIUM 7.5 MG PO TABS: 7.5000 mg | ORAL_TABLET | Freq: Once | ORAL | Status: AC

## 2014-09-08 NOTE — Progress Notes (Signed)
Patient ID: Benjamin Blackwell, male   DOB: 1950/10/07, 64 y.o.   MRN: [id number] [facility] PROGRESS NOTE  Benjamin Blackwell:[id number] DOB: 03-27-1951 DOA: 08/25/2014 PCP: No primary care provider on file.  Brief narrative:    64 y.o. male with past medical history of alcohol abuse and alcohol dependence who presented to [facility] hospital with alcohol related withdrawal seizures. Hhas completed detox therapy for alcohol dependency during this hospital stay.  Hospital course was complicated with finding of atrial fibrillation with RVR. Cardiology has seen the patient in consultation and pt underwent TEE cardioversion 09/02/2014 and requirement is to continue 6 weeks of Coumadin therapy on discharge.   Patient had stress test for risk stratification. He is off of angiomax drip and now on coumadin but need therapeutic INR x 2 consecutive  Times for safe discharge.    Assessment/Plan:    Principal Problem: Alcohol related withdrawal seizures / history of alcohol abuse and alcohol dependence - Completed detox on CIWA - No reports of withdrawals. - Continue folic acid, multivitamin and thiamine.  Active Problems: Hypokalemia - Secondary to diuretic therapy. Potassium supplemented.   Metabolic acidosis - Secondary to alcohol abuse. Resolved.   Atrial fibrillation/flutter with RVR /  - CHADS vasc score 1.  - 2-D echo on this admission with normal ejection fraction. - S/P TEE cardioversion 09/02/2014. Coumadin for 6 weeks thereafter (this now comes out tt 5 weeks). - Rate control with metoprolol 75 mg daily  ST elevations in inferior leads / Troponin elevation - Secondary to demand ischemia from A fib. - AC with coumadin as noted above for next 5 weeks. - Stress test 09/03/2014 showed fixed severe basal to mid inferior and inferolateral defect suggests prior MI, no evidence for significant ischemia.    Essential hypertension - Continue clonidine 0.2 mg twice daily, HCTZ 25 mg  daily, metoprolol 75 mg daily, ramipril 5 mg daily.    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 - LFTs improved with steroids.  - Steroids stopped 09/04/14.   Dyslipidemia - Continue Lipitor 10 mg at bedtime    Hyperglycemia/prediabetes - Hemoglobin A1c 5.8%. Counseled on diabetic diet.   Depression - Continue Prozac.   Thrombocytopenia - Due to bone marrow suppression from alcohol abuse.  - Platelets subsequently normalized. Marland Kitchen   DVT Prophylaxis  - On full dose anticoagulation with Coumadin.     Code Status: Full.  Family Communication: No family at the bedside.  Disposition Plan: needs 2 consecutive INR to be 2-3; per pharmacy difficult to determine appropriate coumadin dose at this point. I agree it would be best to wait for 2 INR in 2-3 range. We need to simplify the regimen for this pt to ensure continuous compliance considering his h/o alcohol use.    IV access:  Peripheral IV  Procedures and diagnostic studies:    Ct Head Wo Contrast 08/25/2014: 1. 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 No significant mass effect to suggest acute or subacute process. 2. Cerebral atrophy and small vessel ischemic change.   Dg Chest Port 1 View 08/25/2014: Moderate cardiomegaly with central vascular congestion but no focal acute finding.   2-D echo 08/27/14  Stress test 09/03/2014   Medical Consultants:  Cardiology   Other Consultants:  None   IAnti-Infectives:   None    [name], MD  [facility] Pager [phone]  Time spent in minutes: 15 minutes  If 7PM-7AM, please contact night-coverage [email or web address] Password [email or web address]  09/08/2014, 11:44 AM   LOS: 14 days    HPI/Subjective: No acute overnight events. Stable but need 2 INR's consecutively to be in 2-3 range.   Objective: Filed Vitals:   09/07/14 1010 09/07/14 1513 09/07/14 2111 09/08/14 0635  BP:  119/68 135/71  158/86  Pulse: 75 57 72 58  Temp:  97.8 F (36.6 C) 98.2 F (36.8 C) 98.5 F (36.9 C)  TempSrc:  Oral Oral Oral  Resp:  18 20 20   Height:      Weight:      SpO2:  99% 99% 98%    Intake/Output Summary (Last 24 hours) at 09/08/14 1144 Last data filed at 09/08/14 [id number]  Gross per 24 hour  Intake  976.3 ml  Output   2175 ml  Net -1198.7 ml    Exam:   General:  Pt is alert, not in acute distress  Cardiovascular: Regular rate and rhythm, S1/S2 (+)  Respiratory: No wheezing, no crackles, no rhonchi  Abdomen: Soft, non tender, non distended, bowel sounds present  Extremities: No edema, pulses palpable bilaterally  Neuro: Nonfocal  Data Reviewed: Basic Metabolic Panel:  Recent Labs Lab 09/02/14 0445 09/06/14 0522 09/07/14 0502 09/08/14 0433  NA 141 137 140 139  K 3.8 4.3 4.2 4.1  CL 106 103 108 104  CO2 26 27 27 29   GLUCOSE 144* 128* 121* 148*  BUN 21 19 18 17   CREATININE 1.16 1.02 1.04 1.15  CALCIUM 9.3 9.0 9.0 9.2   Liver Function Tests: No results for input(s): AST, ALT, ALKPHOS, BILITOT, PROT, ALBUMIN in the last 168 hours. No results for input(s): LIPASE, AMYLASE in the last 168 hours. No results for input(s): AMMONIA in the last 168 hours. CBC:  Recent Labs Lab 09/04/14 0438 09/05/14 0518 09/06/14 0522 09/07/14 0502 09/08/14 0433  WBC 9.3 6.7 6.1 5.6 5.0  HGB 13.9 14.0 14.3 14.3 13.7  HCT 40.5 40.4 41.5 40.4 39.4  MCV 96.9 95.7 97.2 96.0 96.3  PLT 241 233 259 225 234   Cardiac Enzymes: No results for input(s): CKTOTAL, CKMB, CKMBINDEX, TROPONINI in the last 168 hours. BNP: Invalid input(s): POCBNP CBG: No results for input(s): GLUCAP in the last 168 hours.  No results found for this or any previous visit (from the past 240 hour(s)).   Scheduled Meds: . atorvastatin  10 mg Oral q1800  . cloNIDine  0.2 mg Oral BID  . FLUoxetine  20 mg Oral Daily  . folic acid  1 mg Oral Daily  . hydrochlorothiazide  25 mg Oral Daily  . metoprolol  succinate  75 mg Oral Daily  . mirtazapine  7.5 mg Oral QHS  . multivitamin with minerals  1 tablet Oral Daily  . pantoprazole  40 mg Oral Q0600  . potassium chloride  20 mEq Oral BID  . ramipril  5 mg Oral Daily  . thiamine  100 mg Oral Daily  . Warfarin - Pharmacist Dosing Inpatient   Does not apply q1800   Continuous Infusions:

## 2014-09-08 NOTE — Discharge Summary (Addendum)
Physician Discharge Summary  Benjamin Blackwell NGE:[id number] DOB: 11/20/1950 DOA: 08/25/2014  [facility] (Dr. Hyman Blackwell or Dr. Orpah Blackwell)  Admit date: 08/25/2014 Discharge date: 09/08/2014  Recommendations for Outpatient Follow-up:  1. Please take couamding 3.75 mg tonight 09/08/2014. Then recheck INR tomorrow in clinic per sch appt at 3:30pm in community [facility]. Per our pharmacy on Monday you can take 7.5 mg on Monday 4/18.Marland Kitchen. Please make sure no alcohol drinking while you are taking coumadin. Please make sure you are compliant with coumadin. Please if you note bleeding in stool or urine call PCP or come to ED for evaluation. 2. New medication for blood pressure, Hctz, metoprolol, clonidine, ramipril.  Discharge Diagnoses:  Principal Problem:   Alcohol withdrawal seizure Active Problems:   Atrial fibrillation with RVR   Alcohol dependency   Hypertension   Hyperglycemia   Thrombocytopenia   Hypokalemia   Elevated LFTs   Metabolic acidosis   Alcoholic hepatitis   Prediabetes   SOB (shortness of breath)   Seizure   Atrial flutter   Elevated troponin    Discharge Condition: stable   Diet recommendation: as tolerated   History of present illness:  64 y.o. male with past medical history of alcohol abuse and alcohol dependence who presented [name] with alcohol related withdrawal seizures. Hhas completed detox therapy for alcohol dependency during this hospital stay.  Hospital course was complicated with finding of atrial fibrillation with RVR. Cardiology has seen the patient in consultation and pt underwent TEE cardioversion 09/02/2014 and requirement is to continue 6 weeks of Coumadin therapy on discharge.   Patient had stress test for risk stratification. He is now off of angiomax drip and 2 consecutive therapeutic INR's.    Assessment/Plan:    Principal Problem: Alcohol related withdrawal seizures / history of alcohol abuse and alcohol  dependence - Completed detox on CIWA - No reports of withdrawals during hospital stay.  - Continue folic acid, multivitamin and thiamine on discharge.   Active Problems: Hypokalemia - Secondary to diuretic therapy.  - Potassium was supplemented.   Metabolic acidosis - Secondary to alcohol abuse. Resolved.   Atrial fibrillation/flutter with RVR /  - CHADS vasc score 1.  - 2-D echo on this admission with normal ejection fraction. - S/P TEE cardioversion 09/02/2014. Coumadin for 5 weeks; he was on coumadin for 1 week in hospital. - Rate control with metoprolol 75 mg daily  ST elevations in inferior leads / Troponin elevation - Secondary to demand ischemia from A fib. - AC with coumadin as noted above for next 5 weeks. - Stress test 09/03/2014 showed fixed severe basal to mid inferior and inferolateral defect suggests prior MI, no evidence for significant ischemia.    Essential hypertension - Continue clonidine 0.2 mg twice daily, HCTZ 25 mg daily, metoprolol 75 mg daily, ramipril 5 mg daily.    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 - LFTs improved with steroids.  - Steroids stopped 09/04/14.   Dyslipidemia - Continue Lipitor 10 mg at bedtime    Hyperglycemia/prediabetes - Hemoglobin A1c 5.8%. Counseled on diabetic diet.   Depression - Continue Prozac on discharge    Thrombocytopenia - Due to bone marrow suppression from alcohol abuse.  - Platelets now WNL   DVT Prophylaxis  - On full dose anticoagulation with Coumadin.     Code Status: Full.     IV access:  Peripheral IV  Procedures and diagnostic studies:   Ct Head Wo  Contrast 08/25/2014: 1. 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 No significant mass effect to suggest acute or subacute process. 2. Cerebral atrophy and small vessel ischemic change.   Dg Chest Port 1 View 08/25/2014: Moderate cardiomegaly with  central vascular congestion but no focal acute finding.   2-D echo 08/27/14  Stress test 09/03/2014   Medical Consultants:  Cardiology   Other Consultants:  None   IAnti-Infectives:   None     Signed:  [name], MD  [facility] 09/08/2014, 12:11 PM  Pager #: [phone]  Time spent in minutes: less than 30 minutes   Discharge Exam: Filed Vitals:   09/08/14 0635  BP: 158/86  Pulse: 58  Temp: 98.5 F (36.9 C)  Resp: 20   Filed Vitals:   09/07/14 1010 09/07/14 1513 09/07/14 2111 09/08/14 0635  BP:  119/68 135/71 158/86  Pulse: 75 57 72 58  Temp:  97.8 F (36.6 C) 98.2 F (36.8 C) 98.5 F (36.9 C)  TempSrc:  Oral Oral Oral  Resp:  18 20 20   Height:      Weight:      SpO2:  99% 99% 98%    General: Pt is alert, not in acute distress Cardiovascular: Regular rate and rhythm, S1/S2 +, no murmurs Respiratory: Clear to auscultation bilaterally, no wheezing, no crackles, no rhonchi Abdominal: Soft, non tender, non distended, bowel sounds +, no guarding Extremities: no edema, no cyanosis, pulses palpable bilaterally DP and PT Neuro: Grossly nonfocal  Discharge Instructions  Discharge Instructions    Call MD for:  difficulty breathing, headache or visual disturbances    Complete by:  As directed      Call MD for:  persistant dizziness or light-headedness    Complete by:  As directed      Call MD for:  persistant dizziness or light-headedness    Complete by:  As directed      Call MD for:  persistant nausea and vomiting    Complete by:  As directed      Call MD for:  redness, tenderness, or signs of infection (pain, swelling, redness, odor or green/yellow discharge around incision site)    Complete by:  As directed      Call MD for:  severe uncontrolled pain    Complete by:  As directed      Call MD for:  severe uncontrolled pain    Complete by:  As directed      Diet - low sodium heart healthy    Complete by:  As directed      Diet -  low sodium heart healthy    Complete by:  As directed      Discharge instructions    Complete by:  As directed   1. Please take couamding 3.75 mg tonight 09/08/2014. Then recheck INR tomorrow in clinic per sch appt at 3:30pm in community clinic. Per our pharmacy on Monday you can take 7.5 mg on Monday 4/18.Marland Kitchen Please make sure no alcohol drinking while you are taking coumadin. Please make sure you are compliant with coumadin. Please if you note bleeding in stool or urine call PCP or come to ED for evaluation. 2. New medication for blood pressure, Hctz, metoprolol, clonidine, ramipril.     Increase activity slowly    Complete by:  As directed      Increase activity slowly    Complete by:  As directed  Medication List    STOP taking these medications        PRESCRIPTION MEDICATION      TAKE these medications        atorvastatin 10 MG tablet  Commonly known as:  LIPITOR  Take 1 tablet (10 mg total) by mouth daily at 6 PM.     cloNIDine 0.2 MG tablet  Commonly known as:  CATAPRES  Take 1 tablet (0.2 mg total) by mouth 2 (two) times daily.     FLUoxetine 20 MG capsule  Commonly known as:  PROZAC  Take 1 capsule (20 mg total) by mouth daily.     folic acid 1 MG tablet  Commonly known as:  FOLVITE  Take 1 tablet (1 mg total) by mouth daily.     hydrochlorothiazide 25 MG tablet  Commonly known as:  HYDRODIURIL  Take 1 tablet (25 mg total) by mouth daily.     metoprolol succinate 25 MG 24 hr tablet  Commonly known as:  TOPROL-XL  Take 3 tablets (75 mg total) by mouth daily.     mirtazapine 7.5 MG tablet  Commonly known as:  REMERON  Take 1 tablet (7.5 mg total) by mouth at bedtime.     multivitamin with minerals Tabs tablet  Take 1 tablet by mouth daily.     pantoprazole 40 MG tablet  Commonly known as:  PROTONIX  Take 1 tablet (40 mg total) by mouth daily.     potassium chloride SA 20 MEQ tablet  Commonly known as:  K-DUR,KLOR-CON  Take 1 tablet (20 mEq  total) by mouth daily.     ramipril 5 MG capsule  Commonly known as:  ALTACE  Take 1 capsule (5 mg total) by mouth daily.     thiamine 100 MG tablet  Take 1 tablet (100 mg total) by mouth daily.     warfarin 7.5 MG tablet  Commonly known as:  COUMADIN  Take 1 tablet (7.5 mg total) by mouth one time only at 6 PM.           Follow-up Information    Follow up with [facility]    . Go on 09/09/2014.   Why:  Appointment at 3:30 PM arrive at 2:45 PM. Take Photo ID, discharge papers, $20.00 co pay,    Contact information:   [address] [address] [phone]       The results of significant diagnostics from this hospitalization (including imaging, microbiology, ancillary and laboratory) are listed below for reference.    Significant Diagnostic Studies: Ct Head Wo Contrast  08/25/2014   CLINICAL DATA:  Ethanol abuse and seizure.  EXAM: CT HEAD WITHOUT CONTRAST  TECHNIQUE: Contiguous axial images were obtained from the base of the skull through the vertex without intravenous contrast.  COMPARISON:  None.  FINDINGS: Sinuses/Soft tissues: Clear paranasal sinuses and mastoid air cells.  Intracranial: Age advanced cerebral atrophy. Left occipital lobe hypoattenuation is most consistent with remote infarct. Mild low density in the periventricular white matter likely related to small vessel disease. No mass lesion, hemorrhage, hydrocephalus, acute infarct, intra-axial, or extra-axial fluid collection.  IMPRESSION: 1. 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 No significant mass effect to suggest acute or subacute process. 2.  Cerebral atrophy and small vessel ischemic change.   Electronically Signed   By: Jeronimo Greaves M.D.   On: 08/25/2014 17:07   Nm Myocar Multi W/spect W/wall Motion / Ef  09/04/2014   CLINICAL DATA:  Chest pain  EXAM: Lexiscan Myovue  TECHNIQUE: The patient received IV Lexiscan  .  over 15 seconds. 33.0 mCi of Technetium 25m Sestamibi injected at 30 seconds. Quantitative SPECT images were obtained in the vertical, horizontal and short axis planes after a 45 minute delay. Rest images were obtained with similar planes and delay using 10.2 mCi of Technetium 20m Sestamibi.  FINDINGS: ECG:  No ECG changes with Lexiscan infusion.  Quantitiative Gated SPECT EF: 10% with diffuse hypokinesis.  Perfusion Images: There was a medium-sized, severe basal to mid inferior and inferolateral perfusion defect at both rest and with stress.  IMPRESSION: High risk study due to low EF. Fixed severe basal to mid inferior and inferolateral defect suggests prior MI, no evidence for significant ischemia. The degree of depression of ejection fraction is not totally explained by the defect.  Dalton Mclean   Electronically Signed   By: Marca Ancona M.D.   On: 09/04/2014 17:19   Dg Chest Port 1 View  08/25/2014   CLINICAL DATA:  Shortness of breath  EXAM: PORTABLE CHEST - 1 VIEW  COMPARISON:  None.  FINDINGS: The heart size is moderately enlarged without evidence for edema. Cardiac leads obscure detail. Both lungs are clear. The visualized skeletal structures are unremarkable.  IMPRESSION: Moderate cardiomegaly with central vascular congestion but no focal acute finding.   Electronically Signed   By: Christiana Pellant M.D.   On: 08/25/2014 15:48    Microbiology: No results found for this or any previous visit (from the past 240 hour(s)).   Labs: Basic Metabolic Panel:  Recent Labs Lab 09/02/14 0445 09/06/14 0522 09/07/14 0502 09/08/14 0433  NA 141 137 140 139  K 3.8 4.3 4.2 4.1  CL 106 103 108 104  CO2 GLUCOSE 144* 128* 121* 148*  BUN CREATININE 1.16 1.02 1.04 1.15  CALCIUM 9.3 9.0 9.0 9.2   Liver Function Tests: No results for input(s): AST, ALT, ALKPHOS, BILITOT, PROT, ALBUMIN in the last 168 hours. No results for input(s): LIPASE, AMYLASE in the last 168  hours. No results for input(s): AMMONIA in the last 168 hours. CBC:  Recent Labs Lab 09/04/14 0438 09/05/14 0518 09/06/14 0522 09/07/14 0502 09/08/14 0433  WBC 9.3 6.7 6.1 5.6 5.0  HGB 13.9 14.0 14.3 14.3 13.7  HCT 40.5 40.4 41.5 40.4 39.4  MCV 96.9 95.7 97.2 96.0 96.3  PLT 241 233 259 225 234   Cardiac Enzymes: No results for input(s): CKTOTAL, CKMB, CKMBINDEX, TROPONINI in the last 168 hours. BNP: BNP (last 3 results)  Recent Labs  08/27/14 0330  BNP 262.4*    ProBNP (last 3 results) No results for input(s): PROBNP in the last 8760 hours.  CBG: No results for input(s): GLUCAP in the last 168 hours.  Time coordinating discharge: Over 30 minutes

## 2014-09-08 NOTE — Progress Notes (Signed)
ANTICOAGULATION CONSULT NOTE - Follow Up Consult  Pharmacy Consult for Bivalirudin, warfarin Indication: Atrial fibrillation  Allergies  Allergen Reactions  . Beef-Derived Products Hives  . Pork-Derived Products Hives    Patient Measurements: Height: 5\' 8"  (172.7 cm) Weight: 192 lb 10.9 oz (87.4 kg) IBW/kg (Calculated) : 68.4  Vital Signs: Temp: 98.5 F (36.9 C) (04/17 0635) Temp Source: Oral (04/17 0635) BP: 158/86 mmHg (04/17 0635) Pulse Rate: 58 (04/17 0635)  Labs:  Recent Labs  09/06/14 0522 09/06/14 1248 09/07/14 0502 09/07/14 0853 09/07/14 1231 09/08/14 0433  HGB 14.3  --  14.3  --   --  13.7  HCT 41.5  --  40.4  --   --  39.4  PLT 259  --  225  --   --  234  APTT 74*  --   --  85* 85*  --   LABPROT 32.4* 21.7* 26.5*  --   --  31.5*  INR 3.12* 1.87* 2.41*  --   --  3.02*  CREATININE 1.02  --  1.04  --   --  1.15    Estimated Creatinine Clearance: 71.6 mL/min (by C-G formula based on Cr of 1.15).  Medications:  Infusions:     Assessment: [age] yoM found to be in afib with RVR. 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 S/p DCCV 4/12 on Bivalirudin bridge to warfarin.  Warfarin planned for 6 weeks following conversion.  INR trend off bivalirudin: 4/12: 1.42 4/13: 1.55 4/14: 1.61 4/15: 1.87 4/16: 2.40  4/17: 3.02  INR increasing significantly in response to higher warfarin doses.  Provided a lower dose last night but INR may continue to increase tomorrow still as a reflection of the high boost doses.  Since patient has had 2 consecutive INRs >2, bivalirudin was discontinued this AM.    Goal of Therapy:  INR 2-3   Plan:  In consideration for discharge planning, patient's INR response to warfarin doses was delayed so difficult to determine a home regimen at this point.  Will need close INR follow up until a maintenance dose can be established.  Warfarin 7.5 mg tablets may be appropriate for discharge with the recommendation to  just take half of a tablet (3.75mg ) today since INR at top of therapeutic range.  Pharmacy will schedule this dose for this evening if patient has not been discharged.   Clance BollAmanda Chanell Nadeau, PharmD, BCPS Pager: [phone] 09/08/2014 7:20 AM

## 2014-09-08 NOTE — Progress Notes (Signed)
Consulting cardiologist: Dr. Verne Carrowhristopher McAlhany  Seen for followup: Paroxysmal atrial flutter, cardiomyopathy  Subjective:    No complaints of chest pain, palpitations, or dyspnea.  Objective:   Temp:  [97.8 F (36.6 C)-98.5 F (36.9 C)] 98.5 F (36.9 C) (04/17 0635) Pulse Rate:  [57-75] 58 (04/17 0635) Resp:  [18-20] 20 (04/17 0635) BP: (119-158)/(68-86) 158/86 mmHg (04/17 0635) SpO2:  [98 %-99 %] 98 % (04/17 0635) Last BM Date: 09/08/14  Filed Weights   08/25/14 1851 08/27/14 0400 08/28/14 0400  Weight: 188 lb 7.9 oz (85.5 kg) 195 lb 12.3 oz (88.8 kg) 192 lb 10.9 oz (87.4 kg)    Intake/Output Summary (Last 24 hours) at 09/08/14 0808 Last data filed at 09/08/14 [id number]  Gross per 24 hour  Intake  976.3 ml  Output   2175 ml  Net -1198.7 ml    Telemetry: Normal sinus rhythm.  Exam:  General: Appears comfortable.  Lungs: Clear, nonlabored.  Cardiac: Indistinct PMI, RRR without gallop.  Extremities: No pitting edema.  Lab Results:  Basic Metabolic Panel:  Recent Labs Lab 09/06/14 0522 09/07/14 0502 09/08/14 0433  NA 137 140 139  K 4.3 4.2 4.1  CL 103 108 104  CO2 27 27 29   GLUCOSE 128* 121* 148*  BUN 19 18 17   CREATININE 1.02 1.04 1.15  CALCIUM 9.0 9.0 9.2    CBC:  Recent Labs Lab 09/06/14 0522 09/07/14 0502 09/08/14 0433  WBC 6.1 5.6 5.0  HGB 14.3 14.3 13.7  HCT 41.5 40.4 39.4  MCV 97.2 96.0 96.3  PLT 259 225 234    Coagulation:  Recent Labs Lab 09/06/14 1248 09/07/14 0502 09/08/14 0433  INR 1.87* 2.41* 3.02*    TEE 09/02/2014: Study Conclusions  - Left ventricle: Systolic function was moderately to severely reduced. The estimated ejection fraction was in the range of 30% to 35%. Diffuse hypokinesis. - Aortic valve: There was trivial regurgitation. - Mitral valve: No evidence of vegetation. There was mild regurgitation. - Left atrium: The atrium was moderately dilated. No evidence of thrombus in the atrial  cavity or appendage. - Atrial septum: No defect or patent foramen ovale was identified. - Tricuspid valve: No evidence of vegetation. - Pulmonic valve: No evidence of vegetation.  Impressions:  - Limited study due to poor patient tolerance; terminated early; moderate to severe global reduction in LV function; moderate LAE; no LAA thrombus, mild MR, trace TR.   Medications:   Scheduled Medications: . atorvastatin  10 mg Oral q1800  . cloNIDine  0.2 mg Oral BID  . FLUoxetine  20 mg Oral Daily  . folic acid  1 mg Oral Daily  . hydrochlorothiazide  25 mg Oral Daily  . metoprolol succinate  75 mg Oral Daily  . mirtazapine  7.5 mg Oral QHS  . multivitamin with minerals  1 tablet Oral Daily  . pantoprazole  40 mg Oral Q0600  . potassium chloride  20 mEq Oral BID  . ramipril  5 mg Oral Daily  . thiamine  100 mg Oral Daily  . Warfarin - Pharmacist Dosing Inpatient   Does not apply q1800    PRN Medications: acetaminophen **OR** acetaminophen, alum & mag hydroxide-simeth, feeding supplement (ENSURE ENLIVE), ondansetron **OR** ondansetron (ZOFRAN) IV   Assessment:   1. Paroxysmal atrial flutter status post recent TEE cardioversion, maintaining sinus rhythm. Patient has been on bivalirudin as bridge on Coumadin, followed by pharmacy. He has been in therapeutic to supra-therapeutic range for 48 hours. Bivalirudin has been discontinued.  INR today 3.02.  2. Anaphylactic pork allergy, was on bivalirudin for bridging on Coumadin.  3. History of alcohol abuse.  4. Demand ischemia, peak troponin I only 0.12, relatively flat pattern.  5. 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 No active ischemia by Myoview with fixed severe basal to mid inferior and inferolateral defect suggesting prior infarct. 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  6. Essential hypertension.  Plan/Discussion:    INR in therapeutic to  supra-therapeutic range for 48 hours, now off bivalirudin. Continues onToprol-XL, Altace and HCTZ. Likely for discharge today. He will need to have follow-up early next week in the office for an INR check and then to see Dr. Clifton James or associate within 7-10 days. Will have office call him with visit times.   Jonelle Sidle, M.D., F.A.C.C.

## 2014-09-08 NOTE — Progress Notes (Signed)
ANTICOAGULATION CONSULT NOTE - Follow Up Consult  Pharmacy Consult for bivalirudin, warfarin Indication: atrial fibrillation  Allergies  Allergen Reactions  . Beef-Derived Products Hives  . Pork-Derived Products Hives    Patient Measurements: Height: 5\' 8"  (172.7 cm) Weight: 192 lb 10.9 oz (87.4 kg) IBW/kg (Calculated) : 68.4 Heparin Dosing Weight:   Vital Signs: Temp: 98.5 F (36.9 C) (04/17 0635) Temp Source: Oral (04/17 0635) BP: 158/86 mmHg (04/17 0635) Pulse Rate: 58 (04/17 0635)  Labs:  Recent Labs  09/06/14 0522 09/06/14 1248 09/07/14 0502 09/07/14 0853 09/07/14 1231 09/08/14 0433  HGB 14.3  --  14.3  --   --  13.7  HCT 41.5  --  40.4  --   --  39.4  PLT 259  --  225  --   --  234  APTT 74*  --   --  85* 85*  --   LABPROT 32.4* 21.7* 26.5*  --   --  31.5*  INR 3.12* 1.87* 2.41*  --   --  3.02*  CREATININE 1.02  --  1.04  --   --  1.15    Estimated Creatinine Clearance: 71.6 mL/min (by C-G formula based on Cr of 1.15).   Medications:  Infusions:    Assessment: Patient with AM INR at 3.02.  Bivalirudin off for ~4hours prior to INR; will consider INR result unaffected by bivalirudin.  Note 2 days of at least therapeutic INR.  Night NP agreed to stop bivalirudin at this time, per prior MD notes.  Goal of Therapy:  INR 2-3    Plan:  Will follow up with rounding team for anticoagulate and d/c plan.  Darlina GuysGrimsley Jr, Jacquenette ShoneJulian Crowford 09/08/2014,6:39 AM

## 2014-09-09 ENCOUNTER — Ambulatory Visit: Payer: MEDICAID | Attending: Family Medicine | Admitting: Family Medicine

## 2014-09-09 ENCOUNTER — Telehealth: Payer: Self-pay | Admitting: Physician Assistant

## 2014-09-09 ENCOUNTER — Encounter: Payer: Self-pay | Admitting: Family Medicine

## 2014-09-09 ENCOUNTER — Telehealth: Payer: Self-pay | Admitting: *Deleted

## 2014-09-09 VITALS — BP 95/67 | HR 61 | Temp 98.2°F | Resp 18 | Ht 68.0 in | Wt 210.0 lb

## 2014-09-09 DIAGNOSIS — I4891 Unspecified atrial fibrillation: Secondary | ICD-10-CM

## 2014-09-09 DIAGNOSIS — R945 Abnormal results of liver function studies: Secondary | ICD-10-CM

## 2014-09-09 DIAGNOSIS — F1290 Cannabis use, unspecified, uncomplicated: Secondary | ICD-10-CM | POA: Insufficient documentation

## 2014-09-09 DIAGNOSIS — D696 Thrombocytopenia, unspecified: Secondary | ICD-10-CM

## 2014-09-09 DIAGNOSIS — E785 Hyperlipidemia, unspecified: Secondary | ICD-10-CM | POA: Insufficient documentation

## 2014-09-09 DIAGNOSIS — F10239 Alcohol dependence with withdrawal, unspecified: Secondary | ICD-10-CM | POA: Insufficient documentation

## 2014-09-09 DIAGNOSIS — I952 Hypotension due to drugs: Secondary | ICD-10-CM

## 2014-09-09 DIAGNOSIS — F10230 Alcohol dependence with withdrawal, uncomplicated: Secondary | ICD-10-CM

## 2014-09-09 DIAGNOSIS — R569 Unspecified convulsions: Secondary | ICD-10-CM

## 2014-09-09 DIAGNOSIS — Z7901 Long term (current) use of anticoagulants: Secondary | ICD-10-CM | POA: Insufficient documentation

## 2014-09-09 DIAGNOSIS — R000 Tachycardia, unspecified: Secondary | ICD-10-CM | POA: Insufficient documentation

## 2014-09-09 DIAGNOSIS — I10 Essential (primary) hypertension: Secondary | ICD-10-CM | POA: Insufficient documentation

## 2014-09-09 DIAGNOSIS — I959 Hypotension, unspecified: Secondary | ICD-10-CM | POA: Insufficient documentation

## 2014-09-09 DIAGNOSIS — R7989 Other specified abnormal findings of blood chemistry: Secondary | ICD-10-CM

## 2014-09-09 DIAGNOSIS — Z87891 Personal history of nicotine dependence: Secondary | ICD-10-CM | POA: Insufficient documentation

## 2014-09-09 LAB — COMPREHENSIVE METABOLIC PANEL
ALK PHOS: 78 U/L (ref 39–117)
ALT: 86 U/L — AB (ref 0–53)
AST: 53 U/L — AB (ref 0–37)
Albumin: 3.4 g/dL — ABNORMAL LOW (ref 3.5–5.2)
BUN: 17 mg/dL (ref 6–23)
CHLORIDE: 99 meq/L (ref 96–112)
CO2: 26 meq/L (ref 19–32)
Calcium: 8.4 mg/dL (ref 8.4–10.5)
Creat: 1.12 mg/dL (ref 0.50–1.35)
Glucose, Bld: 120 mg/dL — ABNORMAL HIGH (ref 70–99)
Potassium: 3.9 mEq/L (ref 3.5–5.3)
Sodium: 137 mEq/L (ref 135–145)
TOTAL PROTEIN: 6.3 g/dL (ref 6.0–8.3)
Total Bilirubin: 0.6 mg/dL (ref 0.2–1.2)

## 2014-09-09 LAB — POCT INR: INR: 2.6

## 2014-09-09 MED ORDER — SODIUM CHLORIDE 0.9 % IV SOLN
INTRAVENOUS | Status: AC
  Administered 2014-09-09: 17:00:00 via INTRAVENOUS

## 2014-09-09 MED ORDER — LISINOPRIL 5 MG PO TABS: 5.0000 mg | ORAL_TABLET | Freq: Every day | ORAL | Status: DC

## 2014-09-09 MED ORDER — CLONIDINE HCL 0.1 MG PO TABS: 0.1000 mg | ORAL_TABLET | Freq: Two times a day (BID) | ORAL | Status: DC

## 2014-09-09 MED ORDER — METOPROLOL TARTRATE 25 MG PO TABS: 25.0000 mg | ORAL_TABLET | Freq: Two times a day (BID) | ORAL | Status: DC

## 2014-09-09 MED ORDER — MIRTAZAPINE 7.5 MG PO TABS: 7.5000 mg | ORAL_TABLET | Freq: Every day | ORAL | Status: DC

## 2014-09-09 NOTE — Progress Notes (Signed)
Patient had seizure in restaurant on 4/3. Hospitalized 4/3-4/17. Had cardioversion while hospitalized. Patient reports feeling better. Patient currently is not in pain.

## 2014-09-09 NOTE — Telephone Encounter (Signed)
[facility] today as directed on discharge instructions. [facility]  and she states she will call if we are to dose his coumadin phone number [phone] given and she states understanding.

## 2014-09-09 NOTE — Patient Instructions (Signed)
Warfarin tablets What is this medicine? WARFARIN (WAR far in) is an anticoagulant. It is used to treat or prevent clots in the veins, arteries, lungs, or heart. This medicine may be used for other purposes; ask your health care provider or pharmacist if you have questions. COMMON BRAND NAME(S): Coumadin, Jantoven What should I tell my health care provider before I take this medicine? They need to know if you have any of these conditions: -alcoholism -anemia -bleeding disorders -cancer -diabetes -heart disease -high blood pressure -history of bleeding in the gastrointestinal tract -history of stroke or other brain injury or disease -kidney or liver disease -protein C deficiency -protein S deficiency -psychosis or dementia -recent injury, recent or planned surgery or procedure -an unusual or allergic reaction to warfarin, other medicines, foods, dyes, or preservatives -pregnant or trying to get pregnant -breast-feeding How should I use this medicine? Take this medicine by mouth with a glass of water. Follow the directions on the prescription label. You can take this medicine with or without food. Take your medicine at the same time each day. Do not take it more often than directed. Do not stop taking except on your doctor's advice. Stopping this medicine may increase your risk of a blood clot. Be sure to refill your prescription before you run out of medicine. If your doctor or healthcare professional calls to change your dose, write down the dose and any other instructions. Always read the dose and instructions back to him or her to make sure you understand them. Tell your doctor or healthcare professional what strength of tablets you have on hand. Ask how many tablets you should take to equal your new dose. Write the date on the new instructions and keep them near your medicine. If you are told to stop taking your medicine until your next blood test, call your doctor or healthcare  professional if you do not hear anything within 24 hours of the test to find out your new dose or when to restart your prior dose. A special MedGuide will be given to you by the pharmacist with each prescription and refill. Be sure to read this information carefully each time. Talk to your pediatrician regarding the use of this medicine in children. Special care may be needed. Overdosage: If you think you have taken too much of this medicine contact a poison control center or emergency room at once. NOTE: This medicine is only for you. Do not share this medicine with others. What if I miss a dose? It is important not to miss a dose. If you miss a dose, call your healthcare provider. Take the dose as soon as possible on the same day. If it is almost time for your next dose, take only that dose. Do not take double or extra doses to make up for a missed dose. What may interact with this medicine? Do not take this medicine with any of the following medications: -agents that prevent or dissolve blood clots -aspirin or other salicylates -danshen -dextrothyroxine -mifepristone -St. John's Wort -red yeast rice This medicine may also interact with the following medications: -acetaminophen -agents that lower cholesterol -alcohol -allopurinol -amiodarone -antibiotics or medicines for treating bacterial, fungal or viral infections -azathioprine -barbiturate medicines for inducing sleep or treating seizures -certain medicines for diabetes -certain medicines for heart rhythm problems -certain medicines for high blood pressure -chloral hydrate -cisapride -disulfiram -male hormones, including contraceptive or birth control pills -general anesthetics -herbal or dietary products like garlic, ginkgo, ginseng, green tea, or kava  kava -influenza virus vaccine -male hormones -medicines for mental depression or psychosis -medicines for some types of cancer -medicines for stomach  problems -methylphenidate -NSAIDs, medicines for pain and inflammation, like ibuprofen or naproxen -propoxyphene -quinidine, quinine -raloxifene -seizure or epilepsy medicine like carbamazepine, phenytoin, and valproic acid -steroids like cortisone and prednisone -tamoxifen -thyroid medicine -tramadol -vitamin c, vitamin e, and vitamin K -zafirlukast -zileuton This list may not describe all possible interactions. Give your health care provider a list of all the medicines, herbs, non-prescription drugs, or dietary supplements you use. Also tell them if you smoke, drink alcohol, or use illegal drugs. Some items may interact with your medicine. What should I watch for while using this medicine? Visit your doctor or health care professional for regular checks on your progress. You will need to have a blood test called a PT/INR regularly. The PT/INR blood test is done to make sure you are getting the right dose of this medicine. It is important to not miss your appointment for the blood tests. When you first start taking this medicine, these tests are done often. Once the correct dose is determined and you take your medicine properly, these tests can be done less often. Notify your doctor or health care professional and seek emergency treatment if you develop breathing problems; changes in vision; chest pain; severe, sudden headache; pain, swelling, warmth in the leg; trouble speaking; sudden numbness or weakness of the face, arm or leg. These can be signs that your condition has gotten worse. While you are taking this medicine, carry an identification card with your name, the name and dose of medicine(s) being used, and the name and phone number of your doctor or health care professional or person to contact in an emergency. Do not start taking or stop taking any medicines or over-the-counter medicines except on the advice of your doctor or health care professional. You should discuss your diet with  your doctor or health care professional. Do not make major changes in your diet. Vitamin K can affect how well this medicine works. Many foods contain vitamin K. It is important to eat a consistent amount of foods with vitamin K. Other foods with vitamin K that you should eat in consistent amounts are asparagus, basil, beef or pork liver, black eyed peas, broccoli, brussel sprouts, cabbage, chickpeas, cucumber with peel, green onions, green tea, okra, parsley, peas, thyme, and green leafy vegetables like beet greens, collard greens, endive, kale, mustard greens, spinach, turnip greens, watercress, or certain lettuces like green leaf or romaine. This medicine can cause birth defects or bleeding in an unborn child. Women of childbearing age should use effective birth control while taking this medicine. If a woman becomes pregnant while taking this medicine, she should discuss the potential risks and her options with her health care professional. Avoid sports and activities that might cause injury while you are using this medicine. Severe falls or injuries can cause unseen bleeding. Be careful when using sharp tools or knives. Consider using an Copy. Take special care brushing or flossing your teeth. Report any injuries, bruising, or red spots on the skin to your doctor or health care professional. If you have an illness that causes vomiting, diarrhea, or fever for more than a few days, contact your doctor. Also check with your doctor if you are unable to eat for several days. These problems can change the effect of this medicine. Even after you stop taking this medicine, it takes several days before your body recovers  its normal ability to clot blood. Ask your doctor or health care professional how long you need to be careful. If you are going to have surgery or dental work, tell your doctor or health care professional that you have been taking this medicine. What side effects may I notice from  receiving this medicine? Side effects that you should report to your doctor or health care professional as soon as possible: -back pain -chills -dizziness -fever -heavy menstrual bleeding or vaginal bleeding -painful, blue, or purple toes -painful, prolonged erection -signs and symptoms of bleeding such as bloody or black, tarry stools; red or dark-brown urine; spitting up blood or brown material that looks like coffee grounds; red spots on the skin; unusual bruising or bleeding from the eye, gums, or nose-skin rash, itching or skin damage -stomach pain -unusually weak or tired -yellowing of skin or eyes Side effects that usually do not require medical attention (report to your doctor or health care professional if they continue or are bothersome): -diarrhea -hair loss This list may not describe all possible side effects. Call your doctor for medical advice about side effects. You may report side effects to FDA at 1-800-FDA-1088. Where should I keep my medicine? Keep out of the reach of children. Store at room temperature between 15 and 30 degrees C (59 and 86 degrees F). Protect from light. Throw away any unused medicine after the expiration date. Do not flush down the toilet. NOTE: This sheet is a summary. It may not cover all possible information. If you have questions about this medicine, talk to your doctor, pharmacist, or health care provider.  2015, [employer] Standard. (2012-11-29 12:17:56) Hypotension As your heart beats, it forces blood through your arteries. This force is your blood pressure. If your blood pressure is too low for you to go about your normal activities or to support the organs of your body, you have hypotension. Hypotension is also referred to as low blood pressure. When your blood pressure becomes too low, you may not get enough blood to your brain. As a result, you may feel weak, feel lightheaded, or develop a rapid heart rate. In a more severe case, you may  faint. CAUSES Various conditions can cause hypotension. These include:  Blood loss.  Dehydration.  Heart or endocrine problems.  Pregnancy.  Severe infection.  Not having a well-balanced diet filled with needed nutrients.  Severe allergic reactions (anaphylaxis). Some medicines, such as blood pressure medicine or water pills (diuretics), may lower your blood pressure below normal. Sometimes taking too much medicine or taking medicine not as directed can cause hypotension. TREATMENT  Hospitalization is sometimes required for hypotension if fluid or blood replacement is needed, if time is needed for medicines to wear off, or if further monitoring is needed. Treatment might include changing your diet, changing your medicines (including medicines aimed at raising your blood pressure), and use of support stockings. HOME CARE INSTRUCTIONS   Drink enough fluids to keep your urine clear or pale yellow.  Take your medicines as directed by your health care provider.  Get up slowly from reclining or sitting positions. This gives your blood pressure a chance to adjust.  Wear support stockings as directed by your health care provider.  Maintain a healthy diet by including nutritious food, such as fruits, vegetables, nuts, whole grains, and lean meats. SEEK MEDICAL CARE IF:  You have vomiting or diarrhea.  You have a fever for more than 2-3 days.  You feel more thirsty than usual.  You feel weak and tired. SEEK IMMEDIATE MEDICAL CARE IF:   You have chest pain or a fast or irregular heartbeat.  You have a loss of feeling in some part of your body, or you lose movement in your arms or legs.  You have trouble speaking.  You become sweaty or feel lightheaded.  You faint. MAKE SURE YOU:   Understand these instructions.  Will watch your condition.  Will get help right away if you are not doing well or get worse. Document Released: 05/10/2005 Document Revised: 02/28/2013 Document  Reviewed: 11/10/2012 [facility] Patient Information 2015 [place], [place]. This information is not intended to replace advice given to you by your health care provider. Make sure you discuss any questions you have with your health care provider.

## 2014-09-09 NOTE — Telephone Encounter (Signed)
Left message to call back  

## 2014-09-09 NOTE — Telephone Encounter (Signed)
New problem   7day TCM w/rhonda barrett 09/13/14 @10am 

## 2014-09-09 NOTE — Progress Notes (Addendum)
Subjective:    Patient ID: Benjamin Blackwell, male    DOB: 1950-11-20, 64 y.o.   MRN: [id number]  HPI  Benjamin Blackwell is here for a hospital follow up after hospitalization at [facility] from 08/25/14-09/08/14 after he had presented with alcohol related seizures. Labs revealed alcohol level of 34, urine toxicology was positive for Cannabis, he had thrombocytopenia of 108, hypokalemia of 3.2 and LFT's trended up to 444 for AST and 551 for ALT. He completed detox according to [facility] protocol and potassium was repleted.  During his hospital stay he was found to have Atrial fibrillation with rapid ventricular response and so he underwent TEE cardioversion on 09/02/14 with subsequent placement on Coumadin to be continued for 6 Weeks. LFT's improved to 98 and 240 for AST and ALT respectively and platelets at discharge were 234.  Interval History: He reports doing well and has been compliant with his medications. Denies any chest pains or shortness of breath. Scheduled to see Cardiology on 09/13/14  Past Medical History  Diagnosis Date  . Hypertension   . ETOH abuse   . Seizures     ETOH related  . A-fib   . Hyperlipidemia     Past Surgical History  Procedure Laterality Date  . Shoulder surgery      left shoulder  . Tee without cardioversion N/A 09/02/2014    Procedure: TRANSESOPHAGEAL ECHOCARDIOGRAM (TEE);  Surgeon: Lelon Perla, MD;  Location: [place];  Service: Cardiovascular;  Laterality: N/A;  . Cardioversion N/A 09/02/2014    Procedure: CARDIOVERSION;  Surgeon: Lelon Perla, MD;  Location: [facility] ENDOSCOPY;  Service: Cardiovascular;  Laterality: N/A;    History   Social History  . Marital Status: Divorced    Spouse Name: N/A  . Number of Children: 1  . Years of Education: N/A   Occupational History  . Laborer    Social History Main Topics  . Smoking status: Former Smoker    Quit date: 08/09/2014  . Smokeless tobacco: Not on file  . Alcohol Use: 0.0 oz/week    0  Standard drinks or equivalent per week     Comment: Quit drinking alcohol 08/24/14; Daily, 12 pack a day, sometimes more, occasional liquor  . Drug Use: Yes     Comment: No Marijuana x 1 month  . Sexual Activity: Not on file   Other Topics Concern  . Not on file   Social History Narrative   Lives with his common law girlfriend.  Employed full time as a Arts administrator.      Allergies  Allergen Reactions  . Beef-Derived Products Hives  . Pork-Derived Products Hives    Current Outpatient Prescriptions on File Prior to Visit  Medication Sig Dispense Refill  . atorvastatin (LIPITOR) 10 MG tablet Take 1 tablet (10 mg total) by mouth daily at 6 PM. 30 tablet 0  . FLUoxetine (PROZAC) 20 MG capsule Take 1 capsule (20 mg total) by mouth daily. 30 capsule 0  . folic acid (FOLVITE) 1 MG tablet Take 1 tablet (1 mg total) by mouth daily. 30 tablet 0  . Multiple Vitamin (MULTIVITAMIN WITH MINERALS) TABS tablet Take 1 tablet by mouth daily. 30 tablet 0  . potassium chloride SA (K-DUR,KLOR-CON) 20 MEQ tablet Take 1 tablet (20 mEq total) by mouth daily. 30 tablet 0  . thiamine 100 MG tablet Take 1 tablet (100 mg total) by mouth daily. 30 tablet 0  . warfarin (COUMADIN) 7.5 MG tablet Take 1 tablet (7.5 mg total) by mouth  one time only at 6 PM. 35 tablet 0  . hydrochlorothiazide (HYDRODIURIL) 25 MG tablet Take 1 tablet (25 mg total) by mouth daily. (Patient not taking: Reported on 09/09/2014) 30 tablet 0  . pantoprazole (PROTONIX) 40 MG tablet Take 1 tablet (40 mg total) by mouth daily. (Patient not taking: Reported on 09/09/2014) 30 tablet 0   No current facility-administered medications on file prior to visit.     Review of Systems  General: negative for fever, weight loss, appetite change Eyes: no visual symptoms. ENT: no ear symptoms, no sinus tenderness, no nasal congestion or sore throat. Neck: no pain  Respiratory: no wheezing, shortness of breath, cough Cardiovascular: no chest pain, no dyspnea on  exertion, no pedal edema, no orthopnea. Gastrointestinal: no abdominal pain, no diarrhea, no constipation Genito-Urinary: no urinary frequency, no dysuria, no polyuria. Hematologic: no bruising Endocrine: no cold or heat intolerance Neurological: no headaches, no seizures, no tremors Musculoskeletal: no joint pains, no joint swelling Skin: no pruritus, no rash. Psychological: no depression, no anxiety,       Objective: Filed Vitals:   09/09/14 1601 09/09/14 1638 09/09/14 1647  BP: 78/50 112/77 95/67  Pulse: 62  61  Temp: 98.2 F (36.8 C)    TempSrc: Oral    Resp: 18    Height: 5' 8"  (1.727 m)    Weight: 210 lb (95.255 kg)    SpO2: 98%        Physical Exam Constitutional: normal appearing,  Eyes: PERRLA HENT: Head is atraumatic, normal sinuses, normal oropharynx, normal appearing tonsils and palate Neck: normal range of motion, no thyromegaly, no JVD cardiovascular: normal rate and rhythm, normal heart sounds, no murmurs, rub or gallop, no pedal edema Respiratory: clear to auscultation bilaterally, no wheezes, no rales, no rhonchi Abdomen: soft, not tender to palpation, normal bowel sounds, no enlarged organs Extremities: Full ROM, no tenderness in joints Skin: warm and dry, no lesions. Neurological: alert, oriented x3, cranial nerves I-XII grossly intact Psychological: normal mood.   Hepatic Function Latest Ref Rng 09/01/2014 08/29/2014 08/28/2014  Total Protein 6.0 - 8.3 g/dL 7.7 7.8 7.8  Albumin 3.5 - 5.2 g/dL 3.3(L) 3.7 3.6  AST 0 - 37 U/L 98(H) 203(H) 444(H)  ALT 0 - 53 U/L 240(H) 381(H) 551(H)  Alk Phosphatase 39 - 117 U/L 59 62 58  Total Bilirubin 0.3 - 1.2 mg/dL 0.7 0.8 1.3(H)  Bilirubin, Direct 0.0 - 0.5 mg/dL - - -    CBC Latest Ref Rng 09/08/2014 09/07/2014 09/06/2014  WBC 4.0 - 10.5 K/uL 5.0 5.6 6.1  Hemoglobin 13.0 - 17.0 g/dL 13.7 14.3 14.3  Hematocrit 39.0 - 52.0 % 39.4 40.4 41.5  Platelets 150 - 400 K/uL 234 225 259    Lab Results  Component Value Date    INR 2.60 09/09/2014   INR 3.02* 09/08/2014   INR 2.41* 09/07/2014        Assessment & Plan:  64 year old male patient with a history of atrial fibrillation with rapid ventricular rhythm status post cardioversion and is currently on anticoagulation with Coumadin.  Atrial fibrillation with RVR: Status post TEE cardioversion on 09/02/2014 INR is therapeutic at 2.6 He will need to continue anticoagulation for 5 more weeks which will end on 10/14/14. Repeat INR in 2 days Advised [facility]  Hypotension: Medication induced Reduce Metoprolol from 50 to 25 mg bid; reduced Clonidine from 0.60m to 0.159mbid 20063mof IV NS given (given low EF of 30-35%) will  reassess BP after. Patient to return to see me in 2 days for a follow up of BP Return precautions discussed as well as presentation to the ED   Alcohol withdrawal with seizures  He has not had a drink in the last 3 weeks   Thrombocytopenia: Could be secondary to alcohol consumption Will send off CBC  Elevated LFTs Alcohol induced Will repeat LFTs

## 2014-09-10 ENCOUNTER — Telehealth: Payer: Self-pay | Admitting: *Deleted

## 2014-09-10 LAB — CBC WITH DIFFERENTIAL/PLATELET
BASOS ABS: 0.1 10*3/uL (ref 0.0–0.1)
BASOS PCT: 1 % (ref 0–1)
EOS ABS: 0.1 10*3/uL (ref 0.0–0.7)
Eosinophils Relative: 1 % (ref 0–5)
HEMATOCRIT: 38.8 % — AB (ref 39.0–52.0)
Hemoglobin: 13.9 g/dL (ref 13.0–17.0)
LYMPHS PCT: 31 % (ref 12–46)
Lymphs Abs: 1.7 10*3/uL (ref 0.7–4.0)
MCH: 34.1 pg — ABNORMAL HIGH (ref 26.0–34.0)
MCHC: 35.8 g/dL (ref 30.0–36.0)
MCV: 95.1 fL (ref 78.0–100.0)
MONOS PCT: 15 % — AB (ref 3–12)
MPV: 10 fL (ref 8.6–12.4)
Monocytes Absolute: 0.8 10*3/uL (ref 0.1–1.0)
Neutro Abs: 2.9 10*3/uL (ref 1.7–7.7)
Neutrophils Relative %: 52 % (ref 43–77)
Platelets: 238 10*3/uL (ref 150–400)
RBC: 4.08 MIL/uL — AB (ref 4.22–5.81)
RDW: 13.5 % (ref 11.5–15.5)
WBC: 5.5 10*3/uL (ref 4.0–10.5)

## 2014-09-10 NOTE — Progress Notes (Signed)
Quick Note:  Labs addressed at office as well as medications and patient was made aware. ______ 

## 2014-09-10 NOTE — Telephone Encounter (Signed)
[facility]  Patient understands to follow up with provider --Benjamin MareYes--Benjamin Barret, PA on September 13, 2014 at 10:00  Pt given office address. Patient understands discharge instructions? Yes  Patient understands medications and regiment? Yes--Saw primary care yesterday and pt reports medications were changed.  He verbalizes understanding of how to take medications Patient understands to bring all medications to this visit? yes

## 2014-09-10 NOTE — Telephone Encounter (Signed)
Patient called in saying his blood pressure was 79/58 last night before bed and 99/72 this morning upon waking.  He is not sure what he is taking for blood pressure.  Spoke to Dr. Venetia NightAmao who said patient has a follow up appointment with her tomorrow and to let the patient know to not take anymore blood pressure medication today or tonight or in the morning.  He is to come to his appointment and she will address issues then.  Patient understood and appreciative.

## 2014-09-11 ENCOUNTER — Ambulatory Visit: Payer: Self-pay | Attending: Family Medicine | Admitting: Family Medicine

## 2014-09-11 ENCOUNTER — Encounter: Payer: Self-pay | Admitting: Family Medicine

## 2014-09-11 VITALS — BP 106/72 | HR 99 | Temp 98.6°F | Resp 18 | Ht 68.0 in | Wt 191.0 lb

## 2014-09-11 DIAGNOSIS — I959 Hypotension, unspecified: Secondary | ICD-10-CM | POA: Insufficient documentation

## 2014-09-11 DIAGNOSIS — D696 Thrombocytopenia, unspecified: Secondary | ICD-10-CM | POA: Insufficient documentation

## 2014-09-11 DIAGNOSIS — R945 Abnormal results of liver function studies: Secondary | ICD-10-CM

## 2014-09-11 DIAGNOSIS — I952 Hypotension due to drugs: Secondary | ICD-10-CM | POA: Insufficient documentation

## 2014-09-11 DIAGNOSIS — I4891 Unspecified atrial fibrillation: Secondary | ICD-10-CM | POA: Insufficient documentation

## 2014-09-11 DIAGNOSIS — R7989 Other specified abnormal findings of blood chemistry: Secondary | ICD-10-CM | POA: Insufficient documentation

## 2014-09-11 LAB — POCT INR: INR: 2.7

## 2014-09-11 MED ORDER — METOPROLOL TARTRATE 25 MG PO TABS: 12.5000 mg | ORAL_TABLET | Freq: Two times a day (BID) | ORAL | Status: AC

## 2014-09-11 NOTE — Progress Notes (Signed)
Patient here for BP and INR followup.

## 2014-09-11 NOTE — Patient Instructions (Signed)
Hypotension  As your heart beats, it forces blood through your arteries. This force is your blood pressure. If your blood pressure is too low for you to go about your normal activities or to support the organs of your body, you have hypotension. Hypotension is also referred to as low blood pressure. When your blood pressure becomes too low, you may not get enough blood to your brain. As a result, you may feel weak, feel lightheaded, or develop a rapid heart rate. In a more severe case, you may faint.  CAUSES  Various conditions can cause hypotension. These include:  · Blood loss.  · Dehydration.  · Heart or endocrine problems.  · Pregnancy.  · Severe infection.  · Not having a well-balanced diet filled with needed nutrients.  · Severe allergic reactions (anaphylaxis).  Some medicines, such as blood pressure medicine or water pills (diuretics), may lower your blood pressure below normal. Sometimes taking too much medicine or taking medicine not as directed can cause hypotension.  TREATMENT   Hospitalization is sometimes required for hypotension if fluid or blood replacement is needed, if time is needed for medicines to wear off, or if further monitoring is needed. Treatment might include changing your diet, changing your medicines (including medicines aimed at raising your blood pressure), and use of support stockings.  HOME CARE INSTRUCTIONS   · Drink enough fluids to keep your urine clear or pale yellow.  · Take your medicines as directed by your health care provider.  · Get up slowly from reclining or sitting positions. This gives your blood pressure a chance to adjust.  · Wear support stockings as directed by your health care provider.  · Maintain a healthy diet by including nutritious food, such as fruits, vegetables, nuts, whole grains, and lean meats.  SEEK MEDICAL CARE IF:  · You have vomiting or diarrhea.  · You have a fever for more than 2-3 days.  · You feel more thirsty than usual.  · You feel weak and  tired.  SEEK IMMEDIATE MEDICAL CARE IF:   · You have chest pain or a fast or irregular heartbeat.  · You have a loss of feeling in some part of your body, or you lose movement in your arms or legs.  · You have trouble speaking.  · You become sweaty or feel lightheaded.  · You faint.  MAKE SURE YOU:   · Understand these instructions.  · Will watch your condition.  · Will get help right away if you are not doing well or get worse.  Document Released: 05/10/2005 Document Revised: 02/28/2013 Document Reviewed: 11/10/2012  ExitCare® Patient Information ©2015 [employer]. This information is not intended to replace advice given to you by your health care provider. Make sure you discuss any questions you have with your health care provider.

## 2014-09-11 NOTE — Progress Notes (Signed)
Quick Note:  Labs addressed at office as well as medications and patient was made aware. ______ 

## 2014-09-11 NOTE — Progress Notes (Signed)
Subjective:    Patient ID: Benjamin Blackwell, male    DOB: 05-26-1950, 64 y.o.   MRN: [id number]  HPI  Sylvio Weatherall is a 64 year old male with a history of A. fib with rapid ventricular response status post TEE cardioversion on 09/02/14 with subsequent placement on Coumadin to be continued for the next 5 weeks.  I had seen him at his last office visit and he was found to be hypotensive with a blood pressure 78/50 for which he received 200 mls of IV normal saline and I had decreased the dose of his antihypertensives: Metoprolol tartrate 50 mg was reduced to 25 mg twice a day, clonidine was reduced from 0.2-0.1 mg twice a day. He had called yesterday with reports of low blood pressure and was advised to hold off on all his antihypertensives.  He also had blood work done to evaluate elevated LFTs and thrombocytopenia which came back improved.  He has no complaints today and is accompanied by his sister. Scheduled to see cardiology in 2 days.   Past Medical History  Diagnosis Date  . Hypertension   . ETOH abuse   . Seizures     ETOH related  . A-fib   . Hyperlipidemia     Past Surgical History  Procedure Laterality Date  . Shoulder surgery      left shoulder  . Tee without cardioversion N/A 09/02/2014    Procedure: TRANSESOPHAGEAL ECHOCARDIOGRAM (TEE);  Surgeon: Lelon Perla, MD;  Location: [place];  Service: Cardiovascular;  Laterality: N/A;  . Cardioversion N/A 09/02/2014    Procedure: CARDIOVERSION;  Surgeon: Lelon Perla, MD;  Location: [facility] ENDOSCOPY;  Service: Cardiovascular;  Laterality: N/A;    History   Social History  . Marital Status: Divorced    Spouse Name: N/A  . Number of Children: 1  . Years of Education: N/A   Occupational History  . Laborer    Social History Main Topics  . Smoking status: Former Smoker    Quit date: 08/09/2014  . Smokeless tobacco: Not on file  . Alcohol Use: 0.0 oz/week    0 Standard drinks or equivalent per week   Comment: Quit drinking alcohol 08/24/14; Daily, 12 pack a day, sometimes more, occasional liquor  . Drug Use: Yes     Comment: No Marijuana x 1 month  . Sexual Activity: Not on file   Other Topics Concern  . Not on file   Social History Narrative   Lives with his common law girlfriend.  Employed full time as a Arts administrator.      Allergies  Allergen Reactions  . Beef-Derived Products Hives  . Pork-Derived Products Hives    Current Outpatient Prescriptions on File Prior to Visit  Medication Sig Dispense Refill  . atorvastatin (LIPITOR) 10 MG tablet Take 1 tablet (10 mg total) by mouth daily at 6 PM. 30 tablet 0  . FLUoxetine (PROZAC) 20 MG capsule Take 1 capsule (20 mg total) by mouth daily. 30 capsule 0  . folic acid (FOLVITE) 1 MG tablet Take 1 tablet (1 mg total) by mouth daily. 30 tablet 0  . mirtazapine (REMERON) 7.5 MG tablet Take 1 tablet (7.5 mg total) by mouth at bedtime. 30 tablet 0  . Multiple Vitamin (MULTIVITAMIN WITH MINERALS) TABS tablet Take 1 tablet by mouth daily. 30 tablet 0  . potassium chloride SA (K-DUR,KLOR-CON) 20 MEQ tablet Take 1 tablet (20 mEq total) by mouth daily. 30 tablet 0  . thiamine 100 MG tablet Take 1 tablet (  100 mg total) by mouth daily. 30 tablet 0  . warfarin (COUMADIN) 7.5 MG tablet Take 1 tablet (7.5 mg total) by mouth one time only at 6 PM. 35 tablet 0  . pantoprazole (PROTONIX) 40 MG tablet Take 1 tablet (40 mg total) by mouth daily. (Patient not taking: Reported on 09/09/2014) 30 tablet 0   Current Facility-Administered Medications on File Prior to Visit  Medication Dose Route Frequency Provider Last Rate Last Dose  . 0.9 %  sodium chloride infusion   Intravenous Continuous Arnoldo Morale, MD 200 mL/hr at 09/09/14 1659           Review of Systems  Constitutional: Negative for activity change.  HENT: Negative for congestion and postnasal drip.   Respiratory: Positive for shortness of breath. Negative for wheezing.   Cardiovascular: Negative  for chest pain and palpitations.  Gastrointestinal: Negative for abdominal pain and abdominal distention.  Genitourinary: Negative for dysuria and frequency.       Objective: Filed Vitals:   09/11/14 1602  BP: 106/72  Pulse: 99  Temp: 98.6 F (37 C)  TempSrc: Oral  Resp: 18  Height: _0  (1.727 m)  Weight: 191 lb (86.637 kg)  SpO2: 97%      Physical Exam  Constitutional: He is oriented to person, place, and time. He appears well-developed and well-nourished.  Neck: Normal range of motion.  Cardiovascular: Normal rate, regular rhythm and normal heart sounds.   Pulmonary/Chest: Effort normal and breath sounds normal.  Abdominal: Soft. Bowel sounds are normal.  Musculoskeletal: Normal range of motion.  Neurological: He is alert and oriented to person, place, and time.  Psychiatric: He has a normal mood and affect.         Hepatic Function Latest Ref Rng 09/09/2014 09/01/2014 08/29/2014  Total Protein 6.0 - 8.3 g/dL 6.3 7.7 7.8  Albumin 3.5 - 5.2 g/dL 3.4(L) 3.3(L) 3.7  AST 0 - 37 U/L 53(H) 98(H) 203(H)  ALT 0 - 53 U/L 86(H) 240(H) 381(H)  Alk Phosphatase 39 - 117 U/L 78 59 62  Total Bilirubin 0.2 - 1.2 mg/dL 0.6 0.7 0.8  Bilirubin, Direct 0.0 - 0.5 mg/dL - - -   CBC Latest Ref Rng 09/09/2014 09/08/2014 09/07/2014  WBC 4.0 - 10.5 K/uL 5.5 5.0 5.6  Hemoglobin 13.0 - 17.0 g/dL 13.9 13.7 14.3  Hematocrit 39.0 - 52.0 % 38.8(L) 39.4 40.4  Platelets 150 - 400 K/uL 238 234 225   Lab Results  Component Value Date   INR 2.70 09/11/2014   INR 2.60 09/09/2014   INR 3.02* 09/08/2014        Assessment & Plan:  64 year old male patient with a history of atrial fibrillation with rapid ventricular rhythm status post cardioversion and is currently on anticoagulation with Coumadin.  Atrial fibrillation with RVR: Status post TEE cardioversion on 09/02/2014 INR is therapeutic at 2.7 He will need to continue anticoagulation for 5 more weeks which will end on 10/14/14. Repeat INR in 2  days at Cardiology visit and after that he will continue here at the [place] to [facility] on 09/13/14  Hypotension: Medication induced, current blood pressure is low despite not taking any antihypertensive; he needs a beta blocker for rate control Reduce Metoprolol from 25 mg bid to 12.29m bid; discontinue Clonidine, Lisinopril and HCTZ Advised to keep a home BP log. Return precautions discussed as well as presentation to the ED   Thrombocytopenia: Could be secondary to alcohol consumption Improved  Elevated LFTs Alcohol induced Trending down

## 2014-09-13 ENCOUNTER — Encounter: Payer: Self-pay | Admitting: Physician Assistant

## 2014-09-13 ENCOUNTER — Ambulatory Visit (INDEPENDENT_AMBULATORY_CARE_PROVIDER_SITE_OTHER): Payer: Self-pay | Admitting: Physician Assistant

## 2014-09-13 VITALS — BP 120/82 | HR 51 | Ht 68.0 in | Wt 192.2 lb

## 2014-09-13 DIAGNOSIS — I4891 Unspecified atrial fibrillation: Secondary | ICD-10-CM

## 2014-09-13 NOTE — Progress Notes (Addendum)
Cardiology Office Note   Date:  09/13/2014   ID:  Benjamin Blackwell, DOB 06-Apr-1951, MRN [id number]  PCP:  No PCP Per Patient  Cardiologist:  Dr Margart Sickles, PA-C   Follow-up after hospitalization with TEE cardioversion for atrial fibrillation, rapid ventricular response. His EF at the time of the TEE  History of Present Illness: Benjamin Blackwell is a 64 y.o. male with a history of EtOH abuse, EtOH seizures who was recently discharged from the hospital after an admission for seizures and rapid atrial fibrillation. During that admission, he had an echocardiogram 04/05 showing an EF of 50-55 percent. His EF at the TEE/cardioversion 04/11 was 30-35 percent. EF at the time of his Myoview 04/12 was 10% with diffuse hypokinesis. Fixed severe basal to mid inferior and inferolateral defect suggests prior MI, no evidence for significant ischemia.   Benjamin Blackwell presents for follow-up after that admission.  Since discharge, he states he has been compliant with his medications and has not had any alcohol. Of note, he has a tremor. He denies chest pain, shortness of breath or palpitations. He feels he is getting around well and is recovering from the hospitalization. He has no issues or concerns.  Past Medical History  Diagnosis Date  . Hypertension   . ETOH abuse   . Seizures     ETOH related  . Atrial fibrillation with rapid ventricular response 08/2014    S/P TEE/DC CV  . Hyperlipidemia     Past Surgical History  Procedure Laterality Date  . Shoulder surgery      left shoulder  . Tee without cardioversion N/A 09/02/2014    Procedure: TRANSESOPHAGEAL ECHOCARDIOGRAM (TEE);  Surgeon: Lewayne Bunting, MD; EF 30-35 percent    . Cardioversion N/A 09/02/2014    Procedure: CARDIOVERSION;  Surgeon: Lewayne Bunting, MD;  Location: [facility] ENDOSCOPY;  Service: Cardiovascular;  Laterality: N/A;  . Transthoracic echocardiogram  08/27/2014    EF 50-55 percent  . Nm myoview ltd   09/03/2014    Fixed severe basal to mid inferior and inferolateral defect suggests prior MI, no evidence for significant ischemia. EF 10%    Current Outpatient Prescriptions  Medication Sig Dispense Refill  . atorvastatin (LIPITOR) 10 MG tablet Take 1 tablet (10 mg total) by mouth daily at 6 PM. 30 tablet 0  . FLUoxetine (PROZAC) 20 MG capsule Take 1 capsule (20 mg total) by mouth daily. 30 capsule 0  . folic acid (FOLVITE) 1 MG tablet Take 1 tablet (1 mg total) by mouth daily. 30 tablet 0  . metoprolol tartrate (LOPRESSOR) 25 MG tablet Take 0.5 tablets (12.5 mg total) by mouth 2 (two) times daily. Discontinue  60 tablet 1  . mirtazapine (REMERON) 15 MG tablet Take 7.5 mg by mouth at bedtime. Patient taking by mouth (7.5 mg)  one half of 15 mg tablet at bedtime    . Multiple Vitamin (MULTIVITAMIN WITH MINERALS) TABS tablet Take 1 tablet by mouth daily. 30 tablet 0  . potassium chloride SA (K-DUR,KLOR-CON) 20 MEQ tablet Take 1 tablet (20 mEq total) by mouth daily. 30 tablet 0  . thiamine 100 MG tablet Take 1 tablet (100 mg total) by mouth daily. 30 tablet 0  . warfarin (COUMADIN) 7.5 MG tablet Take 1 tablet (7.5 mg total) by mouth one time only at 6 PM. 35 tablet 0   Current Facility-Administered Medications  Medication Dose Route Frequency Provider Last Rate Last Dose  . 0.9 %  sodium chloride infusion  Intravenous Continuous Jaclyn Shaggy, MD 200 mL/hr at 09/09/14 1659      Allergies:   Beef-derived products and Pork-derived products    Social History:  The patient  reports that he quit smoking about 5 weeks ago. He does not have any smokeless tobacco history on file. He reports that he drinks alcohol. He reports that he uses illicit drugs.   Family History:  The patient's family history includes Diabetes in his brother, father, and sister; Heart attack in his brother, brother, father, and mother.    ROS:  Please see the history of present illness. All other systems are reviewed and  negative.    PHYSICAL EXAM: VS:  BP 120/82 mmHg  Pulse 51  Ht  (1.727 m)  Wt 192 lb 3.2 oz (87.181 kg)  BMI 29.23 kg/m2 , BMI Body mass index is 29.23 kg/(m^2). GEN: Well nourished, well developed, in no acute distress HEENT: normal Neck: no JVD, carotid bruits, or masses Cardiac: RRR; soft murmur, rubs, or gallops,no edema  Respiratory:  clear to auscultation bilaterally, normal work of breathing GI: soft, nontender, nondistended, + BS MS: no deformity or atrophy Skin: warm and dry, no rash Neuro:  Strength and sensation are intact; tremor noted in both upper and lower extremities Psych: euthymic mood, full affect   EKG:  EKG is ordered today. The ECG ordered today demonstrates sinus bradycardia, rate 51, no acute ischemic changes, previous ECG was atrial fibrillation/flutter   Recent Labs: 08/26/2014: Magnesium 2.0; TSH 0.746 08/27/2014: B Natriuretic Peptide 262.4* 09/09/2014: ALT 86*; BUN 17; Creatinine 1.12; Hemoglobin 13.9; Platelets 238; Potassium 3.9; Sodium 137    Lipid Panel No results found for: CHOL, TRIG, HDL, CHOLHDL, VLDL, LDLCALC, LDLDIRECT   Wt Readings from Last 3 Encounters:  09/13/14 192 lb 3.2 oz (87.181 kg)  09/11/14 191 lb (86.637 kg)  09/09/14 210 lb (95.255 kg)     Other studies Reviewed: Additional studies/ records that were reviewed today include: Records from previous hospitalization, ECGs  ASSESSMENT AND PLAN:  1.  Atrial fibrillation, rapid ventricular response: Mr. Pousson is maintaining sinus rhythm. He has had problems with hypotension, and because of this, his beta blocker dose is minimal. His heart rate is also low, but he is asymptomatic with this. We will leave his beta blocker at his current dose.   If his blood pressure gets low, he develops chest pain, shortness of breath or palpitations, he is encouraged to check his blood pressure on his home machine which will also give him a heart rate.  2. Hypotension: His blood pressure  is too low today to increase his beta blocker. He was taken off his other blood pressure lowering medications including clonidine and HCTZ because of hypotension.   Since he is off the HCTZ, we will discontinue the potassium. A BMET has been ordered at the time of his INR check next week, we will leave further management of his electrolytes and blood pressure to his primary care physician.  3. Anticoagulation with Coumadin: His last INR was 2.7. He is not having any bleeding issues and is compliant with his medications. He is encouraged to continue this and follow up with his primary care physician.   Current medicines are reviewed at length with the patient today.  The patient does not have concerns regarding medicines.  The following changes have been made:  DC potassium  Labs/ tests ordered today include:   Orders Placed This Encounter  Procedures  . EKG 12-Lead  Disposition:   FU with Dr. Clifton JamesMcAlhany in 3-4 months  Signed, Theodore DemarkRhonda Masahiro Iglesia, PA-C  09/13/2014 1:39 PM    [facility] [address], [place], [place]  [address] Phone: [phone]; Fax: [phone]

## 2014-09-13 NOTE — Patient Instructions (Signed)
Medication Instructions:  1) STOP taking your Potassium.  Labwork: Your physician recommends that you have lab work in: 1 week (BMET) at the lab you spoke with Theodore Demarkhonda Barrett, PA-C about.   Testing/Procedures: None  Follow-Up: Your physician recommends that you schedule a follow-up appointment in: 4 months with Dr. Clifton JamesMcAlhany.   Any Other Special Instructions Will Be Listed Below (If Applicable).

## 2014-09-24 ENCOUNTER — Ambulatory Visit: Payer: Self-pay | Attending: Internal Medicine | Admitting: Pharmacist

## 2014-09-24 ENCOUNTER — Other Ambulatory Visit: Payer: Self-pay | Admitting: Physician Assistant

## 2014-09-24 DIAGNOSIS — I10 Essential (primary) hypertension: Secondary | ICD-10-CM

## 2014-09-24 DIAGNOSIS — I4891 Unspecified atrial fibrillation: Secondary | ICD-10-CM

## 2014-09-24 LAB — POCT INR: INR: 5

## 2014-09-25 ENCOUNTER — Encounter (HOSPITAL_COMMUNITY): Payer: Self-pay | Admitting: Cardiology

## 2014-09-25 NOTE — Addendum Note (Signed)
Addendum  created 09/25/14 1131 by Heather RobertsJames Mallika Sanmiguel, MD   Modules edited: Anesthesia Events

## 2014-09-26 ENCOUNTER — Ambulatory Visit: Payer: Self-pay | Attending: Family Medicine | Admitting: Pharmacist

## 2014-09-26 DIAGNOSIS — D696 Thrombocytopenia, unspecified: Secondary | ICD-10-CM

## 2014-09-26 DIAGNOSIS — I4891 Unspecified atrial fibrillation: Secondary | ICD-10-CM

## 2014-09-26 LAB — POCT INR: INR: 1.9

## 2014-09-26 NOTE — Progress Notes (Signed)
I have reviewed this and agree with plan.

## 2014-10-10 ENCOUNTER — Encounter: Payer: Self-pay | Admitting: Family Medicine

## 2014-10-10 ENCOUNTER — Ambulatory Visit (HOSPITAL_BASED_OUTPATIENT_CLINIC_OR_DEPARTMENT_OTHER): Payer: Self-pay | Admitting: Pharmacist

## 2014-10-10 ENCOUNTER — Ambulatory Visit: Payer: Self-pay | Attending: Family Medicine | Admitting: Family Medicine

## 2014-10-10 ENCOUNTER — Other Ambulatory Visit: Payer: Self-pay | Admitting: Family Medicine

## 2014-10-10 VITALS — BP 148/85 | HR 65 | Temp 97.9°F | Resp 18 | Ht 68.0 in | Wt 198.0 lb

## 2014-10-10 DIAGNOSIS — F1094 Alcohol use, unspecified with alcohol-induced mood disorder: Secondary | ICD-10-CM | POA: Insufficient documentation

## 2014-10-10 DIAGNOSIS — I4891 Unspecified atrial fibrillation: Secondary | ICD-10-CM

## 2014-10-10 DIAGNOSIS — I5022 Chronic systolic (congestive) heart failure: Secondary | ICD-10-CM

## 2014-10-10 DIAGNOSIS — F1021 Alcohol dependence, in remission: Secondary | ICD-10-CM

## 2014-10-10 DIAGNOSIS — R739 Hyperglycemia, unspecified: Secondary | ICD-10-CM

## 2014-10-10 DIAGNOSIS — I5020 Unspecified systolic (congestive) heart failure: Secondary | ICD-10-CM | POA: Insufficient documentation

## 2014-10-10 DIAGNOSIS — I10 Essential (primary) hypertension: Secondary | ICD-10-CM

## 2014-10-10 LAB — POCT INR: INR: 4.1

## 2014-10-10 MED ORDER — MIRTAZAPINE 15 MG PO TABS: 7.5000 mg | ORAL_TABLET | Freq: Every day | ORAL | Status: DC

## 2014-10-10 MED ORDER — RIVAROXABAN 20 MG PO TABS: 20.0000 mg | ORAL_TABLET | Freq: Every day | ORAL | Status: DC

## 2014-10-10 NOTE — Assessment & Plan Note (Signed)
A: A fib with RVR hx, no in NSR with low/normal HR CHADVasc score is 2 This means in the setting of A fib you should be anticoagulated.   P:  For now continue coumadin I have ordered xarelto which we will attempt to get on PASS (medication assistance)  Continue f/u with coumadin clinic until you have received and transitioned to xarelto.  F/u with coumadin clinic next week F/u with me in 6 weeks

## 2014-10-10 NOTE — Progress Notes (Signed)
Pt will hold dose today and then restart on Friday.  He was unable to come back tomorrow to have his INR rechecked.  After discussion with Dr. Armen PickupFunches, the patient will be switching over to Xarelto once his INR is <3.  We will recheck INR on next Tuesday.

## 2014-10-10 NOTE — Assessment & Plan Note (Signed)
A: CHF followed by cards P: Control BP Anticoagulate Fluid management prn  Patient avoiding ETOH

## 2014-10-10 NOTE — Progress Notes (Signed)
Establish Care HFU Sz and Afib No tobacco user since March 2016

## 2014-10-10 NOTE — Assessment & Plan Note (Signed)
A; elevated BP on BB, low pulse P: Add ACEi/ARB at f/u

## 2014-10-10 NOTE — Progress Notes (Signed)
   Subjective:    Patient ID: Benjamin Blackwell, male    DOB: 04/26/1951, 64 y.o.   MRN: [id number] CC: meet PCP, f/u A fib with RVR in setting of ETOH abuse, seizure in setting of ETOH abuse, CHF  HPI  1. A fib: patient dx with A fib with RVR in setting of ETOH abuse. Patient has CHF. Patient has strong fam hx of heart disease, CVA. He is on coumadin for anticoagulation.   2.  ETOH abuse: no longer drinking x  2 months. Patient had  1 seizure. No recurrent seizure. His daughter reports that he looks better than he has in many years.   3.  CHF: followed by cards. No CP or SOB. Compliant with metoprolol.   Soc Hx: former smoker  Review of Systems  Constitutional: Negative for fever and chills.  Respiratory: Negative for cough.   Cardiovascular: Negative for chest pain, palpitations and leg swelling.  Gastrointestinal: Negative for nausea and abdominal pain.  Hematological: Does not bruise/bleed easily.      Objective:   Physical Exam BP 148/85 mmHg  Pulse 65  Temp(Src) 97.9 F (36.6 C) (Oral)  Resp 18  Ht 5\' 8"  (1.727 m)  Wt 198 lb (89.812 kg)  BMI 30.11 kg/m2  SpO2 97% General appearance: alert, cooperative and no distress Neck: no adenopathy, supple, symmetrical, trachea midline and thyroid not enlarged, symmetric, no tenderness/mass/nodules Lungs: clear to auscultation bilaterally Heart: regular rate and rhythm, S1, S2 normal, no murmur, click, rub or gallop  Lab Results  Component Value Date   INR 4.1 10/10/2014   INR 1.9 09/26/2014   INR 5.0 09/24/2014   CHAD2VASc score of 2      Assessment & Plan:

## 2014-10-10 NOTE — Patient Instructions (Addendum)
Benjamin Blackwell,  Your CHADVasc score is 2 This means in the setting of A fib you should be anticoagulated.   For now continue coumadin I have ordered xarelto which we will attempt to get on PASS (medication assistance)  Continue f/u with coumadin clinic until you have received and transitioned to xarelto.  F/u with coumadin clinic next week F/u with me in 6 weeks   Dr. Armen PickupFunches

## 2014-10-10 NOTE — Assessment & Plan Note (Signed)
A; in remission per patient and family  P: Encouraged continued avoidance

## 2014-10-15 ENCOUNTER — Ambulatory Visit: Payer: Self-pay | Attending: Family Medicine | Admitting: Pharmacist

## 2014-10-15 DIAGNOSIS — I4891 Unspecified atrial fibrillation: Secondary | ICD-10-CM

## 2014-10-15 LAB — POCT INR: INR: 1.3

## 2014-10-16 ENCOUNTER — Encounter: Payer: Self-pay | Admitting: Family Medicine

## 2014-10-16 NOTE — Telephone Encounter (Signed)
-----   Message from Oneta RackCourtney B Isom, [facility] sent at 10/15/2014  9:52 AM EDT ----- Regarding: Back to work Hi Dr. Armen PickupFunches,  I saw Mr. Benjamin Blackwell this morning for his INR.  We switched him to Xarelto and he will follow up one more time next week.  He was also wanting to know if he could return to work.  Could you please contact him to let him know?  Thanks!

## 2014-10-16 NOTE — Telephone Encounter (Signed)
Called patient to f.u request for return to work letter. 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 No risk of getting cut.  Feels ready to return to work.  Plan: Letter written to return to work next week Wednesday. Patient will pick up letter next week Tuesday. Patient will let me know if working is too taxing.

## 2014-10-18 ENCOUNTER — Telehealth: Payer: Self-pay | Admitting: *Deleted

## 2014-10-18 NOTE — Telephone Encounter (Signed)
LVM to return call   Letter at front office  

## 2014-11-08 ENCOUNTER — Other Ambulatory Visit: Payer: Self-pay | Admitting: *Deleted

## 2014-11-08 DIAGNOSIS — I4891 Unspecified atrial fibrillation: Secondary | ICD-10-CM

## 2014-11-08 MED ORDER — RIVAROXABAN 20 MG PO TABS: 20.0000 mg | ORAL_TABLET | Freq: Every day | ORAL | Status: AC

## 2014-11-12 ENCOUNTER — Ambulatory Visit: Payer: Self-pay | Attending: Family Medicine | Admitting: Pharmacist

## 2014-11-12 ENCOUNTER — Telehealth: Payer: Self-pay | Admitting: Family Medicine

## 2014-11-12 DIAGNOSIS — F1094 Alcohol use, unspecified with alcohol-induced mood disorder: Secondary | ICD-10-CM

## 2014-11-12 DIAGNOSIS — I4891 Unspecified atrial fibrillation: Secondary | ICD-10-CM

## 2014-11-12 LAB — POCT INR: INR: 1.2

## 2014-11-12 MED ORDER — FLUOXETINE HCL 20 MG PO CAPS: 20.0000 mg | ORAL_CAPSULE | Freq: Every day | ORAL | Status: AC

## 2014-11-12 NOTE — Telephone Encounter (Signed)
Please inform patient prozac refilled  lipitor not advised if patient is still drinking as it can affect the liver

## 2014-11-12 NOTE — Addendum Note (Signed)
Addended by: Dessa Phi on: 11/12/2014 04:10 PM   Modules accepted: Orders, Medications

## 2014-11-12 NOTE — Telephone Encounter (Signed)
Patient came into office requesting medication refill for FLUoxetine (PROZAC) 20 MG capsule, and atorvastatin (LIPITOR) 10 MG tablet. Patient would like medication sent to [facility] pharmacy. Please f/u

## 2014-11-13 NOTE — Telephone Encounter (Signed)
Pt aware will pick up Rx on Thursday

## 2014-11-21 ENCOUNTER — Ambulatory Visit: Payer: Self-pay | Admitting: Family Medicine

## 2014-12-30 ENCOUNTER — Other Ambulatory Visit: Payer: Self-pay | Admitting: Family Medicine

## 2014-12-30 DIAGNOSIS — I10 Essential (primary) hypertension: Secondary | ICD-10-CM

## 2014-12-30 MED ORDER — LISINOPRIL 10 MG PO TABS: 10.0000 mg | ORAL_TABLET | Freq: Every day | ORAL | Status: AC

## 2014-12-30 NOTE — Assessment & Plan Note (Signed)
Patient has not done follow up at instructed  Lisinopril ordered 10 mg daily

## 2023-05-25 DEATH — deceased
